# Patient Record
Sex: Male | Born: 1950
Health system: Southern US, Community
[De-identification: ages and names within clinical notes are randomized; demographics above are authoritative.]

## PROBLEM LIST (undated history)

## (undated) DIAGNOSIS — E119 Type 2 diabetes mellitus without complications: Secondary | ICD-10-CM

## (undated) DIAGNOSIS — C61 Malignant neoplasm of prostate: Secondary | ICD-10-CM

## (undated) HISTORY — PX: PROSTATECTOMY: SHX69

## (undated) HISTORY — DX: Type 2 diabetes mellitus without complications: E11.9

## (undated) HISTORY — DX: Malignant neoplasm of prostate: C61

## (undated) HISTORY — PX: CERVICAL FUSION: SHX112

## (undated) HISTORY — PX: APPENDECTOMY: SHX54

---

## 1998-12-09 ENCOUNTER — Ambulatory Visit (HOSPITAL_COMMUNITY): Admission: RE | Admit: 1998-12-09 | Discharge: 1998-12-09 | Payer: Self-pay | Admitting: Family Medicine

## 1998-12-09 ENCOUNTER — Encounter: Payer: Self-pay | Admitting: Family Medicine

## 2001-10-12 ENCOUNTER — Encounter: Payer: Self-pay | Admitting: Family Medicine

## 2001-10-12 ENCOUNTER — Inpatient Hospital Stay (HOSPITAL_COMMUNITY): Admission: RE | Admit: 2001-10-12 | Discharge: 2001-10-21 | Payer: Self-pay | Admitting: Family Medicine

## 2001-10-12 ENCOUNTER — Encounter (INDEPENDENT_AMBULATORY_CARE_PROVIDER_SITE_OTHER): Payer: Self-pay | Admitting: Specialist

## 2001-10-13 ENCOUNTER — Encounter: Payer: Self-pay | Admitting: General Surgery

## 2002-03-05 ENCOUNTER — Encounter: Payer: Self-pay | Admitting: Emergency Medicine

## 2002-03-05 ENCOUNTER — Inpatient Hospital Stay (HOSPITAL_COMMUNITY): Admission: EM | Admit: 2002-03-05 | Discharge: 2002-03-07 | Payer: Self-pay | Admitting: Emergency Medicine

## 2002-03-06 ENCOUNTER — Encounter: Payer: Self-pay | Admitting: General Surgery

## 2002-03-07 ENCOUNTER — Encounter: Payer: Self-pay | Admitting: General Surgery

## 2002-03-14 ENCOUNTER — Encounter: Admission: RE | Admit: 2002-03-14 | Discharge: 2002-03-14 | Payer: Self-pay | Admitting: General Surgery

## 2002-03-14 ENCOUNTER — Encounter: Payer: Self-pay | Admitting: General Surgery

## 2002-03-16 ENCOUNTER — Encounter: Payer: Self-pay | Admitting: General Surgery

## 2002-03-16 ENCOUNTER — Ambulatory Visit (HOSPITAL_COMMUNITY): Admission: RE | Admit: 2002-03-16 | Discharge: 2002-03-16 | Payer: Self-pay | Admitting: General Surgery

## 2003-08-24 ENCOUNTER — Encounter: Admission: RE | Admit: 2003-08-24 | Discharge: 2003-08-24 | Payer: Self-pay | Admitting: Family Medicine

## 2005-08-01 ENCOUNTER — Emergency Department (HOSPITAL_COMMUNITY): Admission: EM | Admit: 2005-08-01 | Discharge: 2005-08-01 | Payer: Self-pay | Admitting: Emergency Medicine

## 2005-08-01 IMAGING — CT CT CERVICAL SPINE W/O CM
1 series · 1 of 1 positions shown · IV contrast (agent unspecified)
Comparison: none

CLINICAL DATA: Status post fall.
 CERVICAL SPINE CT WITHOUT CONTRAST:
TECHNIQUE: Multidetector CT imaging of the cervical spine was performed.  Multiplanar CT  image reconstructions were also generated.

[Series 1: topogram ap 0.6 t80s · coronal · 0.6mm · 1.00mm/px · 1 of 1 slices shown]
[im 1/1]
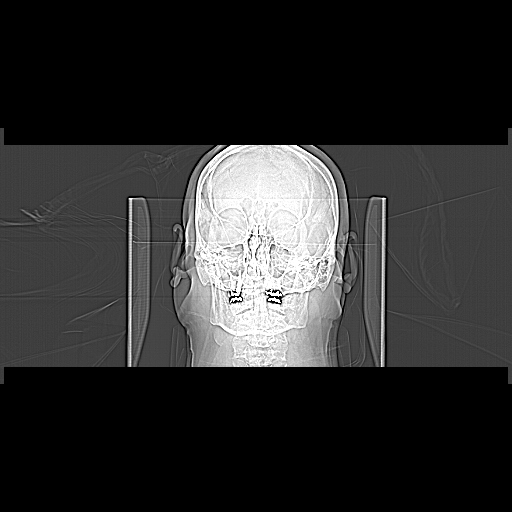

[1 of 1 positions shown; findings below may reference images not displayed]

FINDINGS: The alignment of the cervical spine is normal.  There has been surgical fusion of C5 through C7.  Anterior side plate and screw device is stable in position without evidence for hardware complications.
 Exuberant anterior osteophyte formation is identified at C2, C3 and C4 levels.
IMPRESSION: 1.  No acute findings.  Specifically, I see no acute fractures or dislocations.
 2.  Status post fusion of C5 through C7.  
 3.  Degenerative disk disease with exuberant anterior osteophyte formation extending from C2 through C4.

## 2008-06-05 IMAGING — CR DG CHEST 2V
2 series · 2 of 2 positions shown · non-contrast
Comparison: No priors

CLINICAL DATA: Preop for prostate cancer

CHEST - 2 VIEW

[w chest pa]
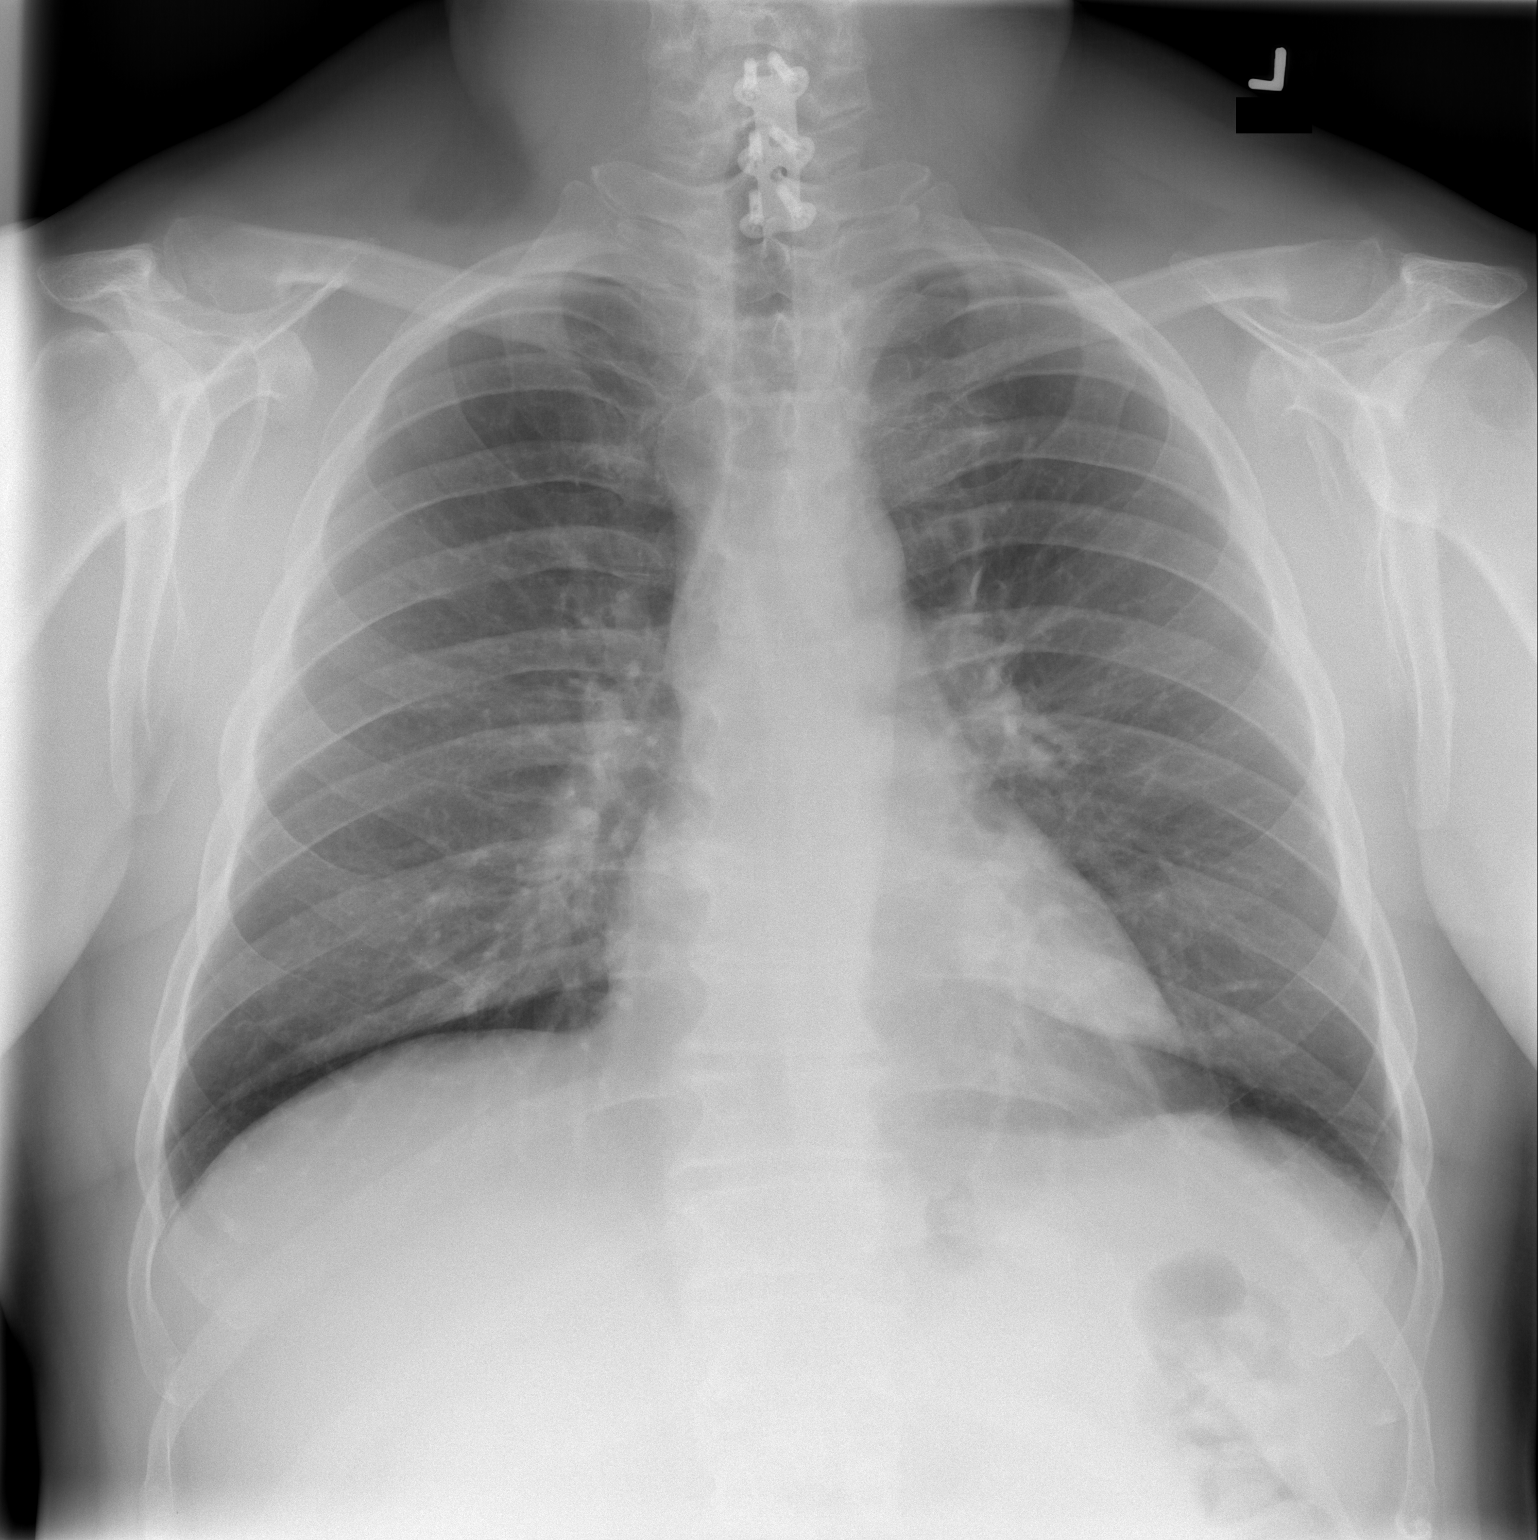

[w chest lat]
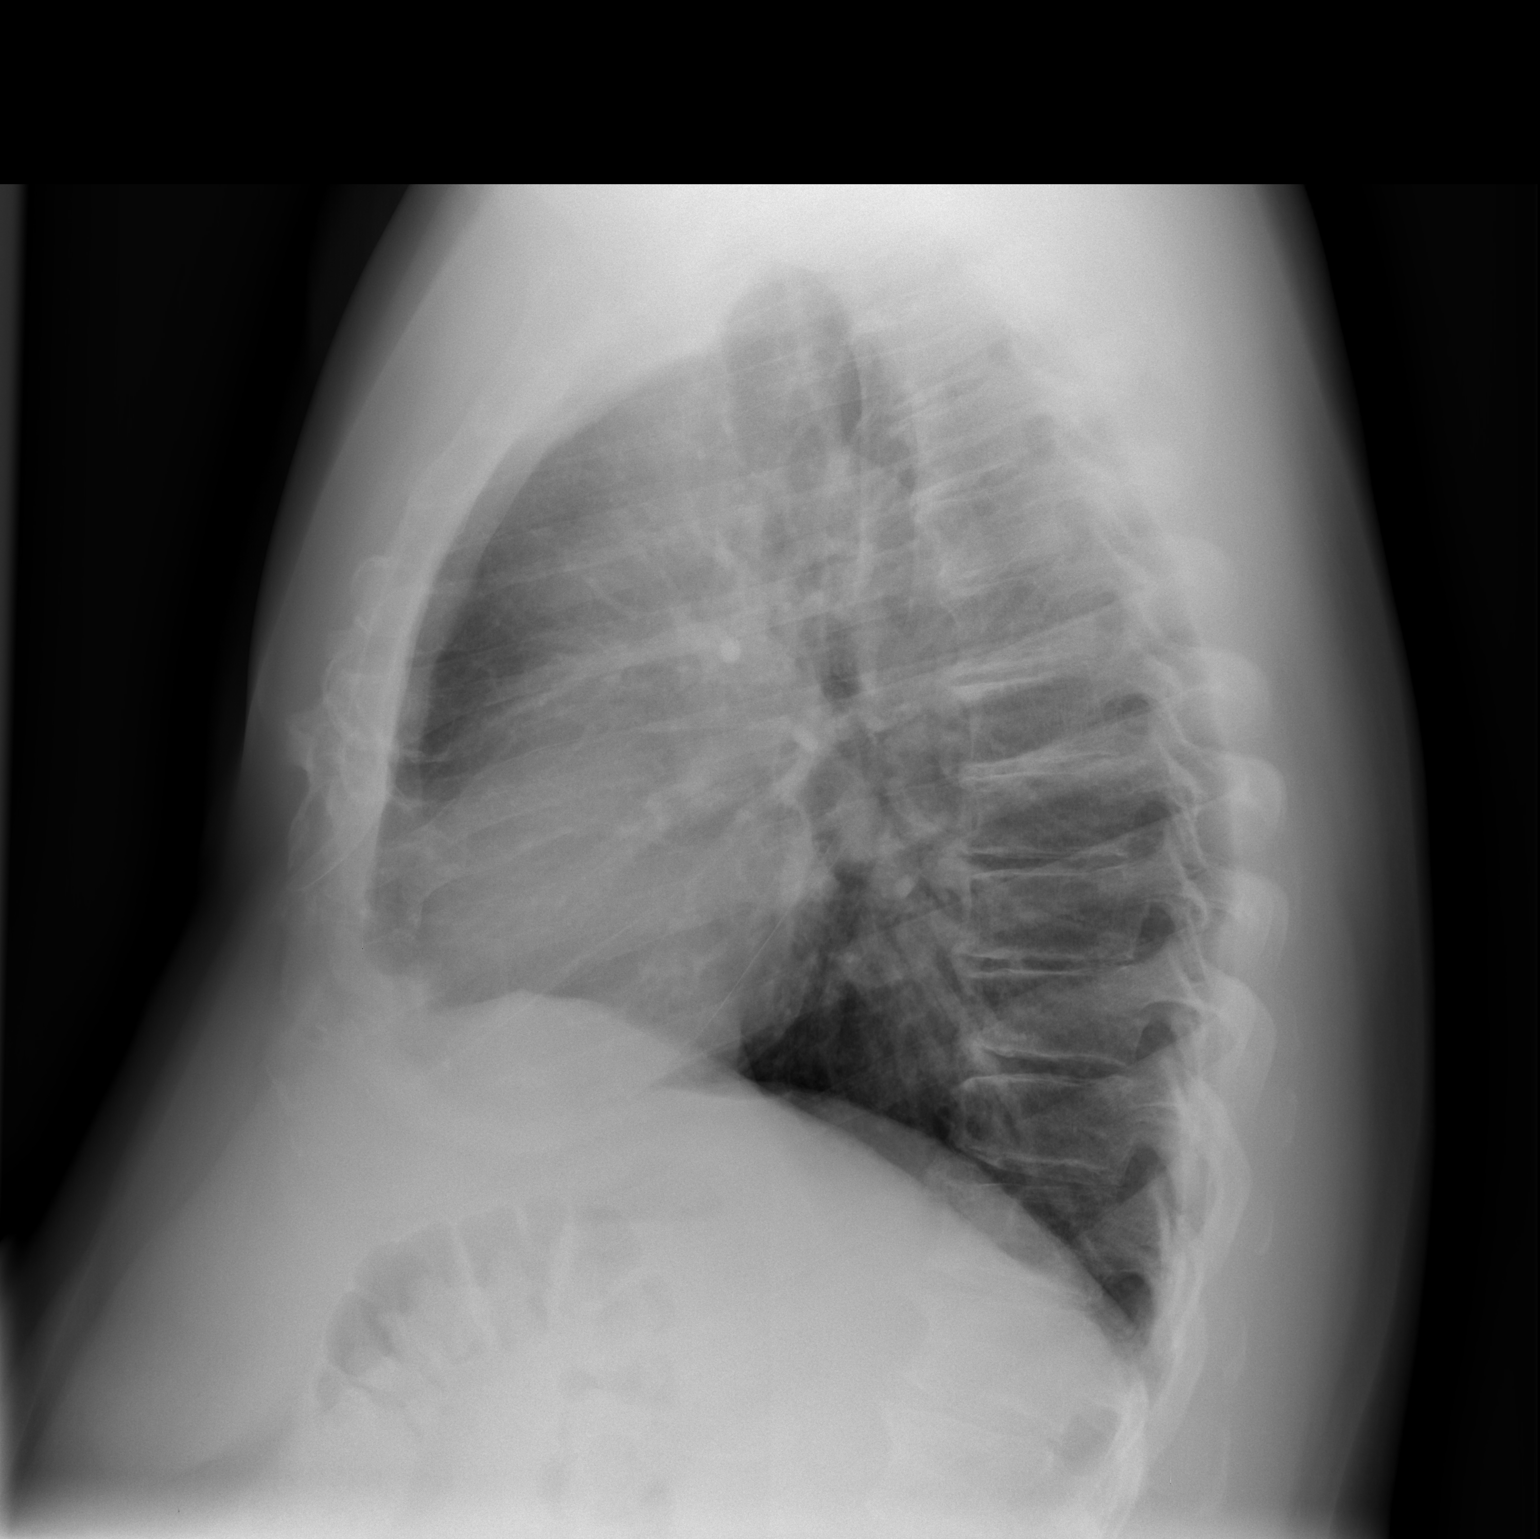

[2 of 2 positions shown; findings below may reference images not displayed]

FINDINGS: Heart and lungs normal.  No osseous lesions.  Prior
cervical fusion with plate.
IMPRESSION: No active disease.

## 2008-06-07 ENCOUNTER — Encounter (INDEPENDENT_AMBULATORY_CARE_PROVIDER_SITE_OTHER): Payer: Self-pay | Admitting: Urology

## 2008-06-07 ENCOUNTER — Inpatient Hospital Stay (HOSPITAL_COMMUNITY): Admission: RE | Admit: 2008-06-07 | Discharge: 2008-06-08 | Payer: Self-pay | Admitting: Urology

## 2009-01-06 ENCOUNTER — Encounter: Payer: Self-pay | Admitting: Emergency Medicine

## 2011-02-05 LAB — BASIC METABOLIC PANEL
BUN: 26 mg/dL — ABNORMAL HIGH (ref 6–23)
CO2: 20 mEq/L (ref 19–32)
Calcium: 8.6 mg/dL (ref 8.4–10.5)
Chloride: 100 mEq/L (ref 96–112)
Creatinine, Ser: 2.97 mg/dL — ABNORMAL HIGH (ref 0.4–1.5)
GFR calc Af Amer: 27 mL/min — ABNORMAL LOW (ref >60)
GFR calc non Af Amer: 22 mL/min — ABNORMAL LOW (ref >60)
Glucose, Bld: 492 mg/dL — ABNORMAL HIGH (ref 70–99)
Potassium: 5.9 mEq/L — ABNORMAL HIGH (ref 3.5–5.1)
Sodium: 135 mEq/L (ref 135–145)

## 2011-02-05 LAB — CBC
HCT: 34.9 % — ABNORMAL LOW (ref 39.0–52.0)
Hemoglobin: 11.5 g/dL — ABNORMAL LOW (ref 13.0–17.0)
MCHC: 32.8 g/dL (ref 30.0–36.0)
RBC: 3.78 MIL/uL — ABNORMAL LOW (ref 4.22–5.81)

## 2011-02-05 LAB — POCT I-STAT 3, ART BLOOD GAS (G3+)
Acid-base deficit: 11 mmol/L — ABNORMAL HIGH (ref 0.0–2.0)
Bicarbonate: 17.4 mEq/L — ABNORMAL LOW (ref 20.0–24.0)
O2 Saturation: 96 %
Patient temperature: 96.5
pO2, Arterial: 94 mmHg (ref 80.0–100.0)

## 2011-02-05 LAB — DIFFERENTIAL
Basophils Relative: 0 % (ref 0–1)
Eosinophils Relative: 0 % (ref 0–5)
Monocytes Absolute: 0.7 10*3/uL (ref 0.1–1.0)
Monocytes Relative: 5 % (ref 3–12)
Neutro Abs: 11.7 10*3/uL — ABNORMAL HIGH (ref 1.7–7.7)

## 2011-02-05 LAB — POCT CARDIAC MARKERS
CKMB, poc: 3.1 ng/mL (ref 1.0–8.0)
Myoglobin, poc: 268 ng/mL (ref 12–200)

## 2011-03-10 NOTE — Op Note (Signed)
Luke Nelson, Luke Nelson            ACCOUNT NO.:  [id number]   MEDICAL RECORD NO.:  [id number]          PATIENT TYPE:  INP   LOCATION:  1444                         FACILITY:  [facility]   PHYSICIAN:  Heloise Purpura, MD      DATE OF BIRTH:  02/25/1951   DATE OF PROCEDURE:  06/07/2008  DATE OF DISCHARGE:                               OPERATIVE REPORT   PREOPERATIVE DIAGNOSIS:  Clinically localized adenocarcinoma of the  prostate.   POSTOPERATIVE DIAGNOSIS:  Clinically localized adenocarcinoma of the  prostate.   PROCEDURES:  1. Laparoscopic intra-abdominal adhesiolysis (This added a significant      component of time to the procedure today, and was related to the      patient's prior exploratory laparotomy.  Total time taking down      adhesions was approximately 1 hour.)  2. Robotic-assisted laparoscopic radical prostatectomy (partial right      nerve sparing).  3. Bilateral pelvic laparoscopic lymphadenectomy.   SURGEON:  Heloise Purpura, MD   FIRST ASSISTANT:  Delman Kitten, MD.   SECOND ASSISTANT:  Delia Chimes, NP.   ANESTHESIA:  General.   COMPLICATIONS:  Cystotomy -- repaired primarily.   ESTIMATED BLOOD LOSS:  1500 mL.   SPECIMENS:  1. Prostate and seminal vesicles.  2. Right pelvic lymph nodes.  3. Left pelvic lymph nodes.   DISPOSITION:  Specimens to pathology.   DRAINS:  1. 20-French Coude catheter.  2. A #19 Blake pelvic drain.   INDICATION:  Mr. Somerville is a 60 year old gentleman who was recently  diagnosed with clinically localized adenocarcinoma of prostate (clinical  stage T2b N0 MX).  After a discussion regarding management options for  treatment, he elected to proceed with surgical therapy and the above  procedure.  The essential risks, complications, and alternative  treatment options associated with the above procedures were discussed  with the patient in detail, and informed consent was obtained.  Specifically, due to the patient's prior history of  an exploratory  laparotomy and intra-abdominal infection, it was discussed with him that  he did have a significant risk for open surgical conversion.   DESCRIPTION OF PROCEDURE:  The patient was taken to the operating room  and a general anesthetic was administered.  He was given preoperative  antibiotics, including Ancef and Flagyl; and he was placed in the dorsal  lithotomy position.  He was then prepped and draped in the usual sterile  fashion.  A preoperative time-out was performed.  A Foley catheter was  inserted into the bladder.  The patient's abdomen was inspected and he  did have a midline abdominal incision from his prior exploratory  laparotomy.  An incision was initially made lateral to the midline,  approximately 18 cm from the pubic symphysis and carried down through  the subcutaneous tissue.  The anterior rectus fascia was divided and the  underlying rectus muscle fibers were swept laterally to either side,  thereby exposing the posterior rectus sheath which was sharply incised.  The peritoneum was identified and was sharply entered.  Digital  inspection of the abdomen did reveal intra-abdominal adhesions, and it  was  not felt safe to place a port here.  It was therefore decided to try  the right lateral abdominal wall.  The patient was rotated to the left  and in the right lateral abdominal wall, a horizontal incision was made  in the skin and again carried down through the layers of the abdominal  wall.  The peritoneum was identified and was sharply incised, allowing  entry into the peritoneal cavity using Hasson technique.  The area was  noted to be free of adhesions upon digital palpation.  A 12 mm port was  therefore inserted and a pneumoperitoneum established.  The abdomen was  then inspected with the 0 degree lens.  This revealed extensive  adhesions along the midline of the abdomen from approximately just below  the umbilicus cephalad.  There were minimal adhesions  in the pelvis and  lower abdomen.  It was felt that with an adhesiolysis, the patient could  proceed with a laparoscopic procedure.  Therefore an 8-mm robotic port  was placed in the right lower quadrant in its normal expected position  for the procedure.  The laparoscopic scissors were then used to take  down the adhesions sharply in the abdominal wall under direct vision.  Once enough adhesions had been taken down, a 5-mm port was placed just  to the right of the umbilicus, again for laparoscopic assistance.  This  was also placed in the normal expected position for this procedure.  This allowed more mobility, and further adhesiolysis of the midline  adhesions was performed, until the camera port site was free and a 12-mm  port was safely placed under direct vision.  Two left lower quadrant 8-  mm robotic ports were also placed.  Total time of the adhesiolysis was  approximately 1 hour.  The surgical cart was then docked.  The patient  was noted to have a large amount of intra-abdominal and pelvic fat, as  well as perivesical fat.  Using the cautery scissors, the bladder was  reflected posteriorly allowing entry into the space of Retzius and  identification of the endopelvic fascia.  The endopelvic fascia was then  incised from the apex back to the base of the prostate bilaterally, and  the underlying levator muscle fibers were swept laterally off the  prostate; thereby isolating the dorsal venous complex.  The dorsal  venous complex was then stapled and divided with a 45-mm flex ET  stapler.   The area was then examined.  The bladder neck was poorly visualized due  to the amount of perivesical fat, despite attempts at manipulation of  the Foley catheter.  The bladder neck was divided anteriorly and the  Foley catheter was identified and was able to be exposed.  The patient  was noted to have an extremely large prostate with large lateral lobes  of the prostate.  The bladder neck was  carefully freed from the prostate  and dissection proceeded posteriorly.  Due to the large prostate, this  dissection was extremely deep.  During this dissection, there was noted  to be an inadvertent cystotomy made.  Indigo carmine had been  administered, and the ureteral orifices were identified and noted to be  away from the cystotomy.  This was immediately repaired with a figure-of-  eight 3-0 Vicryl suture.  Dissection was then redirected more caudally.  The seminal vesicles were identified.  The vasa deferentia were  isolated, divided and lifted anteriorly.  The seminal vesicles were then  dissected free down to  their tips, with care to control the seminal  vesicle arterial blood supply.  The seminal vesicles were then lifted  anteriorly and the space between Denonvilliers' fascia and the anterior  rectum was bluntly developed, thereby isolating the vascular pedicles of  the prostate.  Due to the large size of the prostate, as well as the  anatomy of the patient's pelvis, it was extremely difficult to provide  appropriate counter traction of the prostate during ligation of the  prostatic pedicles.  On the right side, the lateral prostatic fascia was  incised, allowing the neurovascular bundles to be released.  On the left  side a planned wide, non-nerve sparing procedure was performed -- due to  the patient's extensive cancer and abnormal digital rectal exam on this  side.  The vascular pedicles of the prostate were then ligated with Hem-  o-Lok clips and divided with sharp dissection.  During this dissection,  multiple large vessels were encountered and despite attempts to  prospectively control these areas, there was noted be excessive bleeding  despite the pneumoperitoneum.  These areas resulted in a moderate amount  of blood loss and did require suture ligation for control.  The urethra  was then identified and was divided, allowing the prostate to eventually  be disarticulated  -- although with a quite difficult dissection.  The  pelvis was then copiously irrigated.  There was noted to be an  additional arterial bleeding site toward the apex of the prostatic  fossa.  This was ligated with a figure-of-eight 3-0 Vicryl suture.  The  pelvis was then again irrigated with irrigation in the pelvis, air was  injected into the rectal catheter.  There was no evidence of a rectal  injury.  Surgicel was then placed into the pelvis to aid with hemostatic  control, and attention turned to the right pelvic sidewall.  The  fibrofatty tissue between the external iliac vein, confluence of the vas  vessels, the gastric artery and Cooper's ligament was dissected free  from the pelvic sidewall -- with care to preserve the obturator nerve.  Hem-o-lok clips were used for lymphostasis and hemostasis.  An identical  procedure was then performed on the contralateral side and both  lymphatic packets were removed for permanent pathologic analysis.   Attention then returned to the pelvis.  The Surgicel was removed and  there did appear to be adequate hemostasis, except for one area toward  the right distal neurovascular bundle.  This was ligated with a figure-  of-eight 3-0 Vicryl.  A 2-0 Vicryl slip-knot was then placed between  Denonvillier's fascia, the posterior bladder neck and the posterior  urethra to reapproximate these structures.  A double-locked 3-0 Monocryl  suture was used to perform a 360 degree running tension-free anastomosis  between the bladder neck and urethra.  A new 20-French Coude catheter  was then inserted into the bladder and irrigated.  There were no blood  clots within the bladder, and the anastomosis appeared to be watertight.  A #19 Blake drain was then brought through the left robotic port and  appropriately positioned in the pelvis.  It was secured to the skin with  a nylon suture.  The surgical cart was then undocked.  The prostate  specimen was removed  intact within the Endopouch retrieval bag via the  periumbilical port site.  All remaining ports had been removed under  direct vision, after the right lateral port site (which was the initial  entry site into the abdomen)was  closed with a figure-of-eight 0 Vicryl  suture -- placed with the aid of the Carter-Thomason device.  The  periumbilical port site was closed with a running 0 Vicryl suture, and  all sites were instilled with 0.25% Marcaine, reapproximated at the skin  level with staples, and covered with sterile dressings.   The patient appeared to tolerate the procedure well.  He was able to be  extubated and transferred to the recovery unit in satisfactory  condition.      Heloise Purpura, MD  Electronically Signed     LB/MEDQ  D:  06/07/2008  T:  06/07/2008  Job:  [id number]

## 2011-03-13 NOTE — Op Note (Signed)
[facility]  Patient:    DURREL, MCNEE Visit Number: [id number] MRN: [id number]          Service Type: SUR Location: 4W [id number] [id number] Attending Physician:  Henrene Dodge Dictated by:   Anselm Pancoast. Zachery Dakins, M.D. Proc. Date: 10/13/01 Admit Date:  10/12/2001   CC:         Anselm Pancoast. Zachery Dakins, M.D.  Arvella Merles, M.D.   Operative Report  PREOPERATIVE DIAGNOSIS:  Probable small bowel obstruction, fatty liver.  POSTOPERATIVE DIAGNOSIS:  Adhesions, distal small bowel from previous appendectomy, probable fatty liver.  OPERATIONS: 1. Exploratory laparotomy. 2. Lysis of adhesions. 3. Tru-Cut liver biopsy.  ANESTHESIA:  General.  SURGEON:  Anselm Pancoast. Zachery Dakins, M.D.  ASSISTANT:  Currie Paris, M.D.  HISTORY:  Blayze Haen is a 60 year old moderately overweight Caucasian male referred by Dr. Tiburcio Pea yesterday, December 18, for ultrasound and then a CT. He has had severe abdominal pain that started approximately three weeks ago with some blood in his urine and he was treated with Cipro and he had a mildly elevated PSA. He started having abdominal complaints after approximately a week on the Cipro and this progressed and then last Friday, December 13, he started having more severe pain, abdominal distention, and bloating. He has not been on extensive pain medication that I am aware of but had basically very marked pain 48 hours ago, and for this reason they referred him for an ultrasound and then with a questionable _______ proceeded on with CT. I saw the patient and his small bowel was very dilated. I recommended we give him rectal contrast instead of actually oral contrast and this was done. The contrast went throughout the colon and on review of the CT, it appears that the transition is kind of in the distal ileal area. His only previous surgery has been an appendectomy years ago.  DESCRIPTION OF PROCEDURE:  The patient  was taken to the operative suite, induction of general anesthesia, and Foley catheter was inserted. We noted that there is a red splotchy area in the lower abdomen and the patients wife said that he used a heating pad on his abdomen two nights ago when he was having such severe pain that most likely this is the cause of the erythema localized. The abdomen was prepped with Betadine surgical solution and draped in a sterile manner. A small midline incision was made. Sharp dissection was made through the skin, subcutaneous, and fascia and upon opening into the peritoneal cavity, there was a little bit of fluid but it certainly is not as marked as we had seen on the CT December 18. The small bowel area that we could see looked okay and it was necessary to extend the incision some because of just his size. With the extended incision a little bit inferior and above the umbilicus, we now could kind of manipulate the small bowel and there were a few adhesions in the distal terminal bowel where the small bowel possibly is sort of twisted but not really a definite point of obstruction that I can visualize. The colon was normal by palpation, both in the left and right. He does have some adhesions, like possibly kind of early diverticulitis but certainly nothing like acute diverticulitis. We reinspected the small bowel, felt the mesentery, could not feel any definite enlarged lymph nodes or other things. The gallbladder was distended but there was no stones and the little areas of the liver that had been seen on  the ultrasound are little hemangiomas, more on the left lobe than right. You could see them blue and we did not biopsy these. I did two passes of the Tru-Cut needle in the right lobe to basically review his liver since it was interpreted as a real fatty liver on CT. The patient is not a heavy alcohol user and has had no previous history of hepatitis. With this and the hemostasis controlled with  a little cautery to the biopsy area, we then repositioned the small bowel in normal anatomical position. The omentum was brought over it and then closed the incision with interrupted sutures of #1 Novafil. The patient tolerated the procedure nicely and the NG tube is in good position. We will leave the Foley, he had amount of urine in his bladder, and will reassess his small bowel progression with NG tube _______. We plan to use PCA morphine postoperatively for pain control but I am not planning to keep him on IV antibiotics. He was given one dose of Unasyn when admitted preoperatively but I am not planning to continue postoperatively. Dictated by:   Anselm Pancoast. Zachery Dakins, M.D. Attending Physician:  Henrene Dodge DD:  10/13/01 TD:  10/15/01 Job: [id number]

## 2011-03-13 NOTE — Discharge Summary (Signed)
[facility]  Patient:    Luke Nelson, Luke Nelson Visit Number: [id number] MRN: [id number]          Service Type: SUR Location: 4W [id number] [id number] Attending Physician:  Henrene Dodge Dictated by:   Anselm Pancoast. Zachery Dakins, M.D. Admit Date:  03/05/2002 Discharge Date: 03/07/2002                             Discharge Summary  DISCHARGE DIAGNOSIS:  Severe constipation with severe cramping and abdominal pain with partial obstruction.  PROCEDURES:  None.  HISTORY OF PRESENT ILLNESS:  The patient is a 60 year old male who I first met approximately six months ago when he had had kind of cramping bloating and abdominal pain, diarrhea and was thought to have a bowel obstruction.  He underwent an exploratory laparotomy and found to have a few adhesions down in the terminal ileum.  Postoperatively, a C. difficile was found to be positive and it appeared that his abdominal complaints and symptoms were more related to the C. difficile colitis instead of an actual mechanical small bowel obstruction.  During that evaluation, he had been evaluated with a mint Gastrografin barium enema with reflux to terminal ileum.  He had had a fatty liver diagnosed on a CT and at the time of surgery, we did do a liver biopsy that just showed kind of a fatty liver.  He had had an appendectomy.  He gives a history of having previous kidney stones, but during that evaluation, he had been treated with antibiotics for about two to three weeks prior to that presentation for blood in the urine and had never had a definite urinary tract infection or kidney stone documented.  After he recovered with p.o. Flagyl, he had decided to be on a weight reduction diet to correct the fatty liver type problems and has lost approximately 30 pounds and appears to have done nicely and has been seen in the office several times.  I have rechecked a stool that was negative for C. difficile and had released the  patient.  During this presentation, he had been moving his daughter from a dormitory to home for the Summer with a lot of physical activity and started having cramping abdominal pain.  He called the physician on call who advised him to take laxatives.  He did this with no improvement.  On the second call, he was advised to take Fleet Phospho-Soda and then started having severe cramping abdominal pain. He was advised to come to the emergency room.  Dr. Earlene Plater was on call and he admitted him on May 11.  His abdominal x-rays at that time in the emergency room.  She showed a large amount of stool in the right colon with only a small amount of small bowel gas and it appeared that this was a more cramping constipation than a true bowel obstruction.  Dr. Earlene Plater put him in the hospital with IV fluid bolus and gave him Dilaudid for pain and Zosyn for nausea.  He repeated blood work the following morning and asked me to pick the patient up the following morning which was a Monday.  LABORATORY DATA AND X-RAY FINDINGS:  White count 11,700, hematocrit 45 with left shift.  Electrolytes normal with a glucose of 119, BUN 15.  We ordered a stool which was negative.  Specific gravity of his urine was 1.022, with a concentrated urine.  Acute abdomen x-ray was read as no evidence  of obstruction or free air with a large amount of stool in the right colon.  HOSPITAL COURSE:  The following day, he felt better, but of course, he was receiving the Dilaudid.  On my examination, I thought that we really needed to stop the Dilaudid and put him on a clear liquid diet and work with getting the stool out of his colon and this was started.  The following day, the plain abdominal flat and upright showed focal type ileus of small bowel in left upper quadrant thought to be very slight.  The Dilaudid was stopped and he had some cramping pain.  That evening, he had a large explosive bowel movement with a large amount of  stool and then basically his pain resolved.  The following morning on examination, he was definitely better.  The KUB showed no dilated loops and I had placed him on a full liquid diet and thought that if this was tolerated he could be discharged.  He did tolerate the full liquid diet.  On the afternoon of May 13, he was released.  He will see Dr. Zachery Dakins in the office in several days after continuing with the full liquids and then gradually advance up to a regular diet.  I do not think he truly has a mechanical small bowel obstruction at this time, but he will be followed in the office.  The patient, when he was in the hospital in December, was given contrast per rectum for a CT and if he has truly had a BE, I am not positive.  If he would continue to have problems will constipation, we will recommend a colonoscopy. His stools are Hemoccult negative. Dictated by:   Anselm Pancoast. Zachery Dakins, M.D. Attending Physician:  Henrene Dodge DD:  04/03/02 TD:  04/03/02 Job: [id number]

## 2011-03-13 NOTE — H&P (Signed)
[facility]  Patient:    GURKARAN, RAHM Visit Number: [id number] MRN: [id number]          Service Type: SUR Location: 4W [id number] 01 Attending Physician:  Henrene Dodge Dictated by:   Anselm Pancoast. Zachery Dakins, M.D. Admit Date:  10/12/2001                           History and Physical  CHIEF COMPLAINT:  Abdominal pain and bloating.  HISTORY OF PRESENT ILLNESS:  Deontrey Massi is a 60 year old, moderately overweight, Caucasian male who I was asked to see while he was undergoing x-ray evaluations for probably a bowel obstruction.  The patient states that approximately three weeks earlier, he noted some blood in his urine and was being treated by Dr. Tiburcio Pea of [facility], first with Cipro, and then this was increased and then he started having symptoms of abdominal pain. The pain was thought possibly related to the antibiotics and he decreased the antibiotics frequency, and then on Friday, four days earlier, he started having more severe pain, bloated, was not actually vomiting frequently, but obviously was becoming distended.  He returned and was seen by Dr. Tiburcio Pea, who did lab studies and then referred him over for an ultrasound of the upper abdomen, thinking that this was possibly a gallbladder problem.  On the ultrasound, they could not see any gallstones, but he had a very distended small intestine, thought possibly to be an obstruction and they recommended that CT be performed and called me to see him for probably a bowel obstruction.  PAST HISTORY: 1. He has had an appendectomy years ago as a younger teenager and he has had    no other abdominal surgery. 2. He has had problems with spinal effusions, but that is the only    abnormality.  ALLERGIES:  None known.  He said PREDNISONE when he was treated for some type of allergic reaction caused something.  CHRONIC MEDICATIONS:  He is really not on any.  PREVIOUS  SURGERIES: 1. He had a spinal fusion, cervical. 2. He has had an appendectomy years ago.  REVIEW OF SYSTEMS:  EENT:  Denies acute symptoms.  CHEST:  No cough or pulmonary type symptoms.  CARDIAC:  No history of high blood pressure or cardiac arrhythmias or chest pain.  ABDOMEN:  See history of present illness. GENITOURINARY:  He has been evaluated by Dr. Aldean Ast previously for prostate infection, on that I am not sure.  The patient has a family history of cancer of the prostate, and I think he has had a mildly elevated PSA recently and they were wondering if this was related to infections, since he had blood in his urine.  MUSCULOSKELETAL:  Previous neck problem.  He sits a lot and he is a little overweight.  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  His wife is with the patient.  PHYSICAL EXAMINATION:  GENERAL:  The patient looks acutely ill, very bloated.  VITAL SIGNS:  In the emergency room blood pressure is 120/74, pulse 69, respirations 18, he is afebrile at 97.4.  HEENT:  He does not appear to be dehydrated, even though he says he has been able to really eat well over the last 3-4 days.  NECK:  No cervical or supraclavicular nodes.  LUNGS:  Increase in AP diameter.  When he takes deep breaths, he  complains of abdominal pain.  CARDIAC:  Normal sinus rhythm, no murmur, gallop, or rub.  ABDOMEN:  He had kind of a quiet abdomen, very bloated, non-umbilical inguinal hernia secondary to very small transverse Rockey-Davis type of appendectomy incision.  RECTAL:  Prostate exam:  He does not really appear to have an enlarged definite prostate back until a little bit of soft stool in the rectum.  MUSCULOSKELETAL:  I did not do an extensive musculoskeletal evaluation.  He has had a history of previous spinal cervical fusion.  LABORATORY:  Abdominal x-rays:  On the ultrasound I questioned some  spots on his liver.  There were no gallstones noted.  CT was then performed and the liver areas are probable capillary hemangiomas _________ there was nothing consistent with metastatic disease on CT.  He does have a fatty liver.  His small bowel is very dilated.  There is a small amount of gas within the colon, but there is not an obvious massive colon dilatation.  The transition zone on the CT appears to be kind of in the distal small bowel kind of down with the previous appendectomy type incision.  Liver function studies are normal.  The patient denies any excessive use of alcohol or previous exposure to hepatitis.  ADMISSION IMPRESSION:  Abdominal pain, small bowel obstruction, more possibly ileus, rule out a C. difficile colitis, even though his white count is not elevated and he is not having diarrhea.  PLAN:  The patient will be admitted, intravenous fluids, NG tube placed, and further evaluation.  We will get a hepatitis chronic panel. Dictated by:   Anselm Pancoast. Zachery Dakins, M.D. Attending Physician:  Henrene Dodge DD:  10/14/01 TD:  10/14/01 Job: [id number]

## 2011-03-13 NOTE — Discharge Summary (Signed)
[facility]  Patient:    Luke, Nelson Visit Number: [id number] MRN: [id number]          Service Type: SUR Location: 4W 0449 01 Attending Physician:  Henrene Dodge Dictated by:   Anselm Pancoast. Zachery Dakins, M.D. Admit Date:  10/12/2001 Discharge Date: 10/21/2001   CC:         Rozanna Boer., M.D.  Arvella Merles, M.D.   Discharge Summary  DISCHARGE DIAGNOSES: 1. Clostridium difficile colitis. 2. Ileus versus mechanical small bowel obstruction. 3. Fatty liver.  OPERATION:  Exploratory laparotomy, lysis of adhesions, terminal ileum and core biopsy liver.  HISTORY OF PRESENT ILLNESS:  Luke Nelson is a 60 year old Caucasian male who approximately 3 weeks ago after symptoms of frequency of voiding the evening was noted to have a small amount of blood in his urine and with slightly elevated PSA.  He has a family history of cancer of the prostate, and was placed on Cipro by Dr. Holley Bouche.  The patient after 4 or 5 days on the Cipro started having sort of vague abdominal symptoms, and then after about 2 weeks on the Cipro started having more intense pain, he started decreasing his doses of Cipro on his own accord.  He saw Dr. Tiburcio Pea in followup, and was complaining of significant abdominal pain, and they repeated laboratory studies, and this was done on 10/11/01, with his liver tests being normal except for an SGPT of 93, upper limits of normal, and an amylase was 29, lipase 11.5.  White count was 9700 with 72% neutrophils.  Hematocrit was 45.  The following day he was scheduled for an ultrasound of his gallbladder. He was describing the pain more in the upper abdomen.  He complained of nausea and some more bloating.  One day he had several loose bowel movements, and then the following day on the ultrasound it showed no stones within his gallbladder, but his small bowel was significantly dilated, and they  suggested obtaining a CT.  Dr. Tiburcio Pea called for a surgical consultation at that time, and I saw the patient in x-ray.  He was more or less arriving with pains, very bloated, he is a large individual anyway, and in discussion with the radiologist, it certainly appeared that he had an intestinal obstruction.  He had been throwing up, and we elected to give him rectal contrast to the small bowel because of the possibility of him possibly needing emergency surgery. He had the rectal contrast, and this with the CT and the intravenous contrast, we noted that he has a significant fatty liver, there is some question of defects of the liver on the ultrasound, and these were noted to be venous ______, and the small bowel was markedly dilated on down to the terminal ileum where there was definitely a change in the caliber of the bowel.  Years ago the patient had an appendectomy as a child, and we placed a nasogastric tube, started him on intravenous fluids, and discussed about that he may or may need a laparotomy.  The following day, he had repeat x-rays.  He had a large amount of NG drainage, and basically no bowel movements.  He had one bowel movement right after the CT, but he was getting rid of the rectal contrast he had been given.  We had ordered a stool C. diff since he had been ill for approximately 3 weeks, but he was in the emergency room and basically a stool was not obtained.  The following day on the x-rays, the contrast that had been given into the colon was still present.  The small bowel was not as maximally dilated, but it was thought that he had decompressed only from what had sucked out through the nasogastric tube, as he had no passage of the contrast that had been given rectally, and I interpreted this as he probably did have a mechanical small bowel obstruction.  His white count here, the initial white count was 10,000, and the following day it was actually 8300. The patient was  concerned of what was going on, he stated he was feeling no better, not interested in waiting to see if there was any further improvement with conservative management, and desired to proceed on with a laparotomy.  He was taken to surgery on October 13, 2001, Dr. Jamey Ripa assisted.  Even though he had a very dilated small bowel, even in the terminal ileum where he had a few adhesions, it was really not a complete transition like you would see if there was actually an obstruction secondary to adhesions.  His liver did look fatty, and a core biopsy x 2 was obtained.  He had some little ______  cysts, these were deemed noted on the ultrasound.  We continued with the nasogastric tube.  I had placed a Foley catheter in at the time.  I gave him one dose of Unasyn preoperatively, but not continue it postoperatively since there was no active infection noted, and no intestinal surgery except for lysis of adhesions.  Over the next about 3 days, he was sore, his pain was controlled with PCA morphine, and he was taking a dose in relationship to his body weight, and had a moderate amount of NG drainage.  The bowels started having a little passage of flatus and gas, and about the third postoperative day it was elected to take the NG tube out the following morning, and he kept in good p.o., and then continued to have gas, and with noted improvement started on a liquid diet.  Just as soon as he actually was able to basically collect a stool sample, it was sent for the C. diff that had been ordered originally, and it did return positive for C. diff colitis.  At that point, we did start him on p.o. Flagyl, and his abdominal function has continued to improve.  His incision is healing nicely.  In retrospect, this is probably an atypical presentation of C. diff colitis as the cause of the abdominal distention and what was interpreted as an obstruction.  He had a few loose bowel movements, but he did not have an  elevated white count, did not have a fever, and of  course the abdominal pain would have been consistent with C. diff colitis. His Foley catheter had been removed on about the second postoperative day, and I did ask Dr. Aldean Ast to stop by and say hi since he will need to be followed for his PSA at a later time, and Dr. Aldean Ast has recommended that he be seen in her office in approximately I think 3 weeks.  He is ready for discharge now.  Denies no need for pain medication.  He is being sent home on Flagyl 250 mg q.i.d. x 1 week, and will follow up in our office in approximately 10 days.  He can return to work next week if he feels like it.  DIET:  He is going to be on full liquids, advancing onto a regular diet  at this time, and he can advance his diet as tolerated.  His incisions are healed satisfactorily.  The staples were removed.  The wound Steri-Strips.  His pathology report of the fatty liver shows marked steatosis, and this is thought to be related basically his exogenous obesity and not that of a definite cirrhosis or other problems since there was no fibrosis identified, and no increase iron.  CONDITION ON DISCHARGE:  The patient is discharged in improved condition, and has copies of his pathology reports and labs.  FOLLOWUP: 1. He will see me in the office in approximately 7 to 10 days. 2. Follow up with Dr. Aldean Ast in approximately 3 weeks.  I did not repeat a PSA.  He is voiding without problems at this time. Dictated by:   Anselm Pancoast. Zachery Dakins, M.D. Attending Physician:  Henrene Dodge DD:  10/21/01 TD:  10/22/01 Job: [id number]

## 2011-07-24 LAB — CBC
Hemoglobin: 14.9
Platelets: 216
RBC: 4.91
RDW: 13.9
WBC: 7.2

## 2011-07-24 LAB — DIFFERENTIAL
Basophils Absolute: 0.1
Eosinophils Relative: 0
Lymphocytes Relative: 10 — ABNORMAL LOW
Neutro Abs: 11.1 — ABNORMAL HIGH
Neutrophils Relative %: 82 — ABNORMAL HIGH

## 2011-07-24 LAB — BASIC METABOLIC PANEL
BUN: 10
Calcium: 7.7 — ABNORMAL LOW
Calcium: 9
GFR calc Af Amer: >60
GFR calc non Af Amer: >60
GFR calc non Af Amer: >60
Glucose, Bld: 148 — ABNORMAL HIGH
Potassium: 4.6
Sodium: 142

## 2011-07-24 LAB — HEMOGLOBIN AND HEMATOCRIT, BLOOD
HCT: 34.5 — ABNORMAL LOW
HCT: 42.1
Hemoglobin: 11.6 — ABNORMAL LOW
Hemoglobin: 14

## 2011-07-24 LAB — TYPE AND SCREEN
ABO/RH(D): O POS
Antibody Screen: NEGATIVE

## 2011-07-24 LAB — ABO/RH: ABO/RH(D): O POS

## 2015-10-10 ENCOUNTER — Ambulatory Visit: Payer: BLUE CROSS/BLUE SHIELD | Admitting: Neurology

## 2015-10-23 ENCOUNTER — Encounter: Payer: Self-pay | Admitting: Neurology

## 2015-10-23 ENCOUNTER — Ambulatory Visit (INDEPENDENT_AMBULATORY_CARE_PROVIDER_SITE_OTHER): Payer: BLUE CROSS/BLUE SHIELD | Admitting: Neurology

## 2015-10-23 VITALS — BP 138/74 | HR 71 | Ht 71.0 in | Wt 272.0 lb

## 2015-10-23 DIAGNOSIS — F0781 Postconcussional syndrome: Secondary | ICD-10-CM | POA: Diagnosis not present

## 2015-10-23 NOTE — Patient Instructions (Signed)
1. Schedule MRI brain without contrast 2. Physical and cognitive rest are important after a concussion 3. Continue to monitor mood 4. Follow-up in 3 months, call for any changes
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
## 2015-11-11 ENCOUNTER — Telehealth: Payer: Self-pay | Admitting: Neurology

## 2015-11-11 NOTE — Telephone Encounter (Signed)
Patient returned my call. Notified him of results. 

## 2015-11-11 NOTE — Telephone Encounter (Signed)
Have you had a chance to look at his scan?

## 2015-11-11 NOTE — Telephone Encounter (Signed)
Pt wants to see if the mri result are back please call [phone]
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
## 2015-11-11 NOTE — Telephone Encounter (Signed)
Lmovm to rtn my call. 

## 2016-01-28 ENCOUNTER — Ambulatory Visit (INDEPENDENT_AMBULATORY_CARE_PROVIDER_SITE_OTHER): Payer: BLUE CROSS/BLUE SHIELD | Admitting: Neurology

## 2016-01-28 ENCOUNTER — Encounter: Payer: Self-pay | Admitting: Neurology

## 2016-01-28 VITALS — BP 126/74 | HR 67 | Ht 71.0 in | Wt 271.0 lb

## 2016-01-28 DIAGNOSIS — F0781 Postconcussional syndrome: Secondary | ICD-10-CM

## 2016-01-28 DIAGNOSIS — F411 Generalized anxiety disorder: Secondary | ICD-10-CM | POA: Diagnosis not present

## 2016-01-28 MED ORDER — ESCITALOPRAM OXALATE 10 MG PO TABS: ORAL_TABLET | ORAL | Status: DC

## 2016-01-28 NOTE — Patient Instructions (Signed)
1. Start Lexapro 10mg : Take 1 tablet daily for 2 weeks, then increase to 2 tablets daily 2. Physical exercise and brain stimulation exercises (crossword puzzles, word search, etc) are important for brain health 3. Call our office in a month to update on your condition 4. Follow-up in 6 months
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
## 2016-02-17 ENCOUNTER — Telehealth: Payer: Self-pay | Admitting: Neurology

## 2016-02-17 NOTE — Telephone Encounter (Signed)
I spoke with patient at his last ov he was started on Lexapro 10 mg with instructions to increase to 20 mg after 2 weeks. He states that since starting the medication he feels "drugged", just drowsy all the time. States he's tried taking medication as night and still has the same effect the next day. Also states since starting the medication he has had some indigestion issues. He has still been taking the 10 mg dose, hasn't increased. Please advise.

## 2016-02-17 NOTE — Telephone Encounter (Signed)
Lmovm to rtn my call. 

## 2016-02-17 NOTE — Telephone Encounter (Signed)
PT called and wanted to update on his medication Dr Delice Lesch prescribed/Dawn CB# [phone] or [phone]

## 2016-02-17 NOTE — Telephone Encounter (Signed)
He may stop Lexapro.  Instead, he can try sertaline.  Start 25mg  at bedtime for 7 days, then increase to 50mg  at bedtime

## 2016-02-18 MED ORDER — SERTRALINE HCL 25 MG PO TABS: ORAL_TABLET | ORAL | Status: DC

## 2016-02-18 NOTE — Telephone Encounter (Signed)
Patient returned my call. Notified him of advisement. He is agreeable to try sertraline he will stop the Lexapro. Will send new Rx to his pharmacy. Advised him to call us with any questions or concerns with new med.

## 2016-03-07 DIAGNOSIS — H00016 Hordeolum externum left eye, unspecified eyelid: Secondary | ICD-10-CM | POA: Diagnosis not present

## 2016-05-11 DIAGNOSIS — J029 Acute pharyngitis, unspecified: Secondary | ICD-10-CM | POA: Diagnosis not present

## 2016-05-13 DIAGNOSIS — K148 Other diseases of tongue: Secondary | ICD-10-CM | POA: Diagnosis not present

## 2016-05-13 DIAGNOSIS — J029 Acute pharyngitis, unspecified: Secondary | ICD-10-CM | POA: Diagnosis not present

## 2016-07-10 DIAGNOSIS — C61 Malignant neoplasm of prostate: Secondary | ICD-10-CM | POA: Diagnosis not present

## 2016-07-17 DIAGNOSIS — N5201 Erectile dysfunction due to arterial insufficiency: Secondary | ICD-10-CM | POA: Diagnosis not present

## 2016-07-17 DIAGNOSIS — Z8546 Personal history of malignant neoplasm of prostate: Secondary | ICD-10-CM | POA: Diagnosis not present

## 2016-08-04 ENCOUNTER — Encounter: Payer: Self-pay | Admitting: Neurology

## 2016-08-04 ENCOUNTER — Ambulatory Visit (INDEPENDENT_AMBULATORY_CARE_PROVIDER_SITE_OTHER): Payer: BLUE CROSS/BLUE SHIELD | Admitting: Neurology

## 2016-08-04 VITALS — BP 126/78 | HR 78 | Temp 98.4°F | Ht 71.0 in | Wt 274.1 lb

## 2016-08-04 DIAGNOSIS — F411 Generalized anxiety disorder: Secondary | ICD-10-CM | POA: Diagnosis not present

## 2016-08-04 DIAGNOSIS — F0781 Postconcussional syndrome: Secondary | ICD-10-CM

## 2016-08-04 MED ORDER — SERTRALINE HCL 25 MG PO TABS: ORAL_TABLET | ORAL | 11 refills | Status: AC

## 2016-08-04 NOTE — Patient Instructions (Signed)
1. Reduce Sertraline: Sertraline 25mg  1/2 tablet daily 2. Follow-up in 6 months, call for any changes
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
## 2016-08-06 ENCOUNTER — Encounter: Payer: Self-pay | Admitting: Neurology

## 2016-08-26 DIAGNOSIS — G4733 Obstructive sleep apnea (adult) (pediatric): Secondary | ICD-10-CM | POA: Diagnosis not present

## 2016-09-09 DIAGNOSIS — G4733 Obstructive sleep apnea (adult) (pediatric): Secondary | ICD-10-CM | POA: Diagnosis not present

## 2017-02-10 ENCOUNTER — Ambulatory Visit: Payer: BLUE CROSS/BLUE SHIELD | Admitting: Neurology

## 2017-02-24 ENCOUNTER — Encounter: Payer: Self-pay | Admitting: Neurology

## 2017-02-24 ENCOUNTER — Ambulatory Visit (INDEPENDENT_AMBULATORY_CARE_PROVIDER_SITE_OTHER): Payer: BLUE CROSS/BLUE SHIELD | Admitting: Neurology

## 2017-02-24 VITALS — BP 138/72 | HR 87 | Temp 98.1°F | Ht 72.0 in | Wt 268.0 lb

## 2017-02-24 DIAGNOSIS — F0781 Postconcussional syndrome: Secondary | ICD-10-CM
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
## 2017-02-24 NOTE — Patient Instructions (Signed)
Looking great! Have a safe trip. Follow-up on as needed basis, call for any changes.

## 2017-06-22 DIAGNOSIS — M67911 Unspecified disorder of synovium and tendon, right shoulder: Secondary | ICD-10-CM | POA: Diagnosis not present

## 2017-07-06 DIAGNOSIS — M67911 Unspecified disorder of synovium and tendon, right shoulder: Secondary | ICD-10-CM | POA: Diagnosis not present

## 2017-07-16 DIAGNOSIS — Z8546 Personal history of malignant neoplasm of prostate: Secondary | ICD-10-CM | POA: Diagnosis not present

## 2017-07-20 DIAGNOSIS — N5201 Erectile dysfunction due to arterial insufficiency: Secondary | ICD-10-CM | POA: Diagnosis not present

## 2017-07-20 DIAGNOSIS — Z8546 Personal history of malignant neoplasm of prostate: Secondary | ICD-10-CM | POA: Diagnosis not present

## 2017-08-18 DIAGNOSIS — M25562 Pain in left knee: Secondary | ICD-10-CM | POA: Diagnosis not present

## 2018-03-03 DIAGNOSIS — J069 Acute upper respiratory infection, unspecified: Secondary | ICD-10-CM | POA: Diagnosis not present

## 2018-03-03 DIAGNOSIS — J309 Allergic rhinitis, unspecified: Secondary | ICD-10-CM | POA: Diagnosis not present

## 2018-07-15 DIAGNOSIS — Z8546 Personal history of malignant neoplasm of prostate: Secondary | ICD-10-CM | POA: Diagnosis not present

## 2018-07-22 DIAGNOSIS — N5201 Erectile dysfunction due to arterial insufficiency: Secondary | ICD-10-CM | POA: Diagnosis not present

## 2018-07-22 DIAGNOSIS — Z8546 Personal history of malignant neoplasm of prostate: Secondary | ICD-10-CM | POA: Diagnosis not present

## 2018-09-26 DIAGNOSIS — G4733 Obstructive sleep apnea (adult) (pediatric): Secondary | ICD-10-CM | POA: Diagnosis not present

## 2018-10-06 DIAGNOSIS — G4733 Obstructive sleep apnea (adult) (pediatric): Secondary | ICD-10-CM | POA: Diagnosis not present

## 2019-04-09 DIAGNOSIS — H6092 Unspecified otitis externa, left ear: Secondary | ICD-10-CM | POA: Diagnosis not present

## 2019-04-11 DIAGNOSIS — H6692 Otitis media, unspecified, left ear: Secondary | ICD-10-CM | POA: Diagnosis not present

## 2019-04-11 DIAGNOSIS — L089 Local infection of the skin and subcutaneous tissue, unspecified: Secondary | ICD-10-CM | POA: Diagnosis not present

## 2019-04-14 DIAGNOSIS — H9192 Unspecified hearing loss, left ear: Secondary | ICD-10-CM | POA: Diagnosis not present

## 2019-04-14 DIAGNOSIS — H9209 Otalgia, unspecified ear: Secondary | ICD-10-CM | POA: Diagnosis not present

## 2019-04-14 DIAGNOSIS — H906 Mixed conductive and sensorineural hearing loss, bilateral: Secondary | ICD-10-CM | POA: Diagnosis not present

## 2019-04-14 DIAGNOSIS — H60332 Swimmer's ear, left ear: Secondary | ICD-10-CM | POA: Diagnosis not present

## 2019-05-17 DIAGNOSIS — G4733 Obstructive sleep apnea (adult) (pediatric): Secondary | ICD-10-CM | POA: Diagnosis not present

## 2019-08-02 DIAGNOSIS — Z20828 Contact with and (suspected) exposure to other viral communicable diseases: Secondary | ICD-10-CM | POA: Diagnosis not present

## 2019-08-03 DIAGNOSIS — G4733 Obstructive sleep apnea (adult) (pediatric): Secondary | ICD-10-CM | POA: Diagnosis not present

## 2019-08-29 ENCOUNTER — Other Ambulatory Visit: Payer: Self-pay

## 2019-08-29 DIAGNOSIS — Z20822 Contact with and (suspected) exposure to covid-19: Secondary | ICD-10-CM

## 2019-08-31 LAB — NOVEL CORONAVIRUS, NAA: SARS-CoV-2, NAA: DETECTED — AB

## 2019-10-03 DIAGNOSIS — R251 Tremor, unspecified: Secondary | ICD-10-CM | POA: Diagnosis not present

## 2019-10-03 DIAGNOSIS — Z23 Encounter for immunization: Secondary | ICD-10-CM | POA: Diagnosis not present

## 2019-10-03 DIAGNOSIS — F5101 Primary insomnia: Secondary | ICD-10-CM | POA: Diagnosis not present

## 2019-10-03 DIAGNOSIS — Z8546 Personal history of malignant neoplasm of prostate: Secondary | ICD-10-CM | POA: Diagnosis not present

## 2019-10-03 DIAGNOSIS — Z0000 Encounter for general adult medical examination without abnormal findings: Secondary | ICD-10-CM | POA: Diagnosis not present

## 2019-10-25 DIAGNOSIS — H2513 Age-related nuclear cataract, bilateral: Secondary | ICD-10-CM | POA: Diagnosis not present

## 2019-11-02 DIAGNOSIS — G4733 Obstructive sleep apnea (adult) (pediatric): Secondary | ICD-10-CM | POA: Diagnosis not present

## 2019-11-14 DIAGNOSIS — R748 Abnormal levels of other serum enzymes: Secondary | ICD-10-CM | POA: Diagnosis not present

## 2019-12-05 DIAGNOSIS — R739 Hyperglycemia, unspecified: Secondary | ICD-10-CM | POA: Diagnosis not present

## 2019-12-15 DIAGNOSIS — E119 Type 2 diabetes mellitus without complications: Secondary | ICD-10-CM | POA: Diagnosis not present

## 2019-12-15 DIAGNOSIS — Z1211 Encounter for screening for malignant neoplasm of colon: Secondary | ICD-10-CM | POA: Diagnosis not present

## 2019-12-15 DIAGNOSIS — R1011 Right upper quadrant pain: Secondary | ICD-10-CM | POA: Diagnosis not present

## 2019-12-18 ENCOUNTER — Other Ambulatory Visit: Payer: Self-pay | Admitting: Family Medicine

## 2019-12-18 DIAGNOSIS — R1011 Right upper quadrant pain: Secondary | ICD-10-CM

## 2019-12-26 ENCOUNTER — Encounter: Payer: Self-pay | Admitting: Dietician

## 2019-12-26 ENCOUNTER — Ambulatory Visit
Admission: RE | Admit: 2019-12-26 | Discharge: 2019-12-26 | Disposition: A | Discharge status: Home / Self Care | Payer: BLUE CROSS/BLUE SHIELD | Source: Ambulatory Visit | Attending: Family Medicine | Admitting: Family Medicine

## 2019-12-26 ENCOUNTER — Encounter: Payer: PPO | Attending: Family Medicine | Admitting: Dietician

## 2019-12-26 ENCOUNTER — Other Ambulatory Visit: Payer: Self-pay

## 2019-12-26 DIAGNOSIS — E119 Type 2 diabetes mellitus without complications: Secondary | ICD-10-CM | POA: Diagnosis not present

## 2019-12-26 DIAGNOSIS — R1011 Right upper quadrant pain: Secondary | ICD-10-CM

## 2019-12-26 DIAGNOSIS — K7689 Other specified diseases of liver: Secondary | ICD-10-CM | POA: Diagnosis not present

## 2019-12-26 IMAGING — US US ABDOMEN LIMITED
1 series · 13 of 25 positions shown · non-contrast
Comparison: CT scan from [53]

CLINICAL DATA: Right upper quadrant abdominal pain.

EXAM:
ULTRASOUND ABDOMEN LIMITED RIGHT UPPER QUADRANT

[Series 1: us abdomen limited · 0.30mm/px · 13 of 79 slices shown]
[im 1/79]
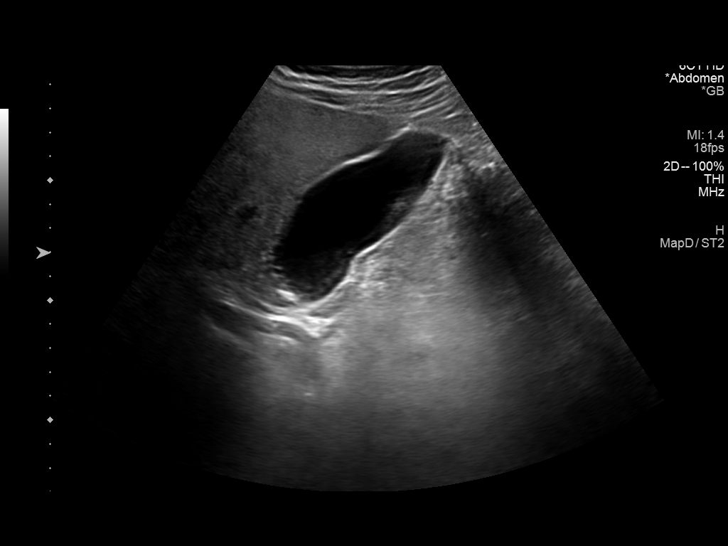
[im 7/79]
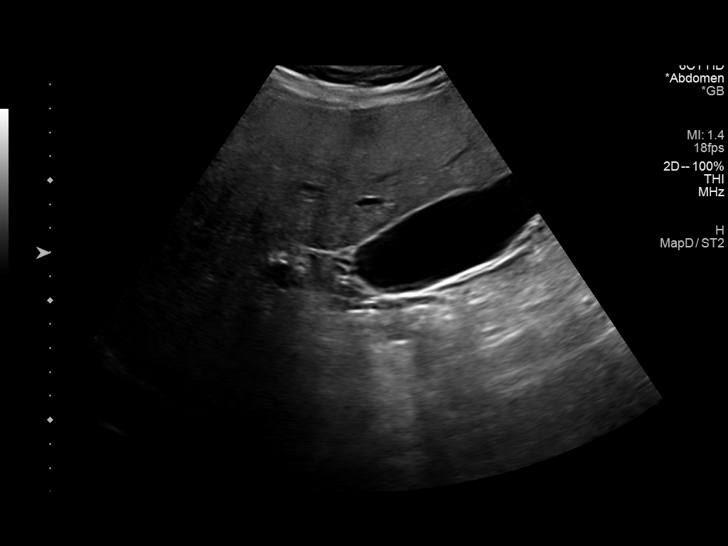
[im 14/79]
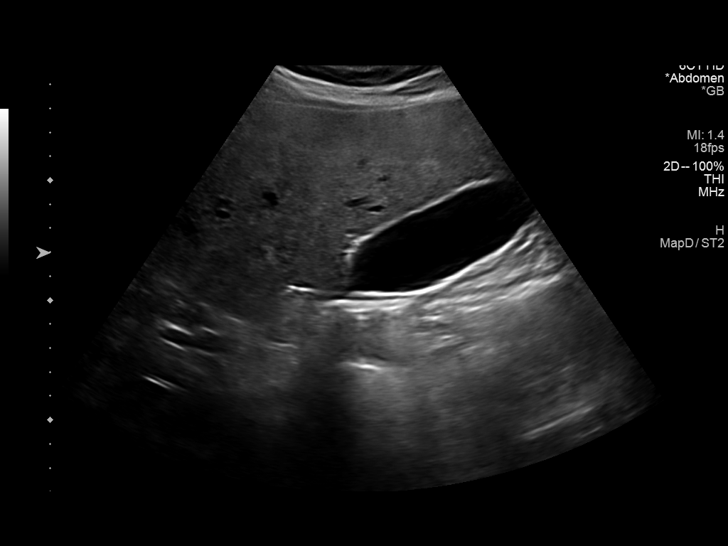
[im 20/79]
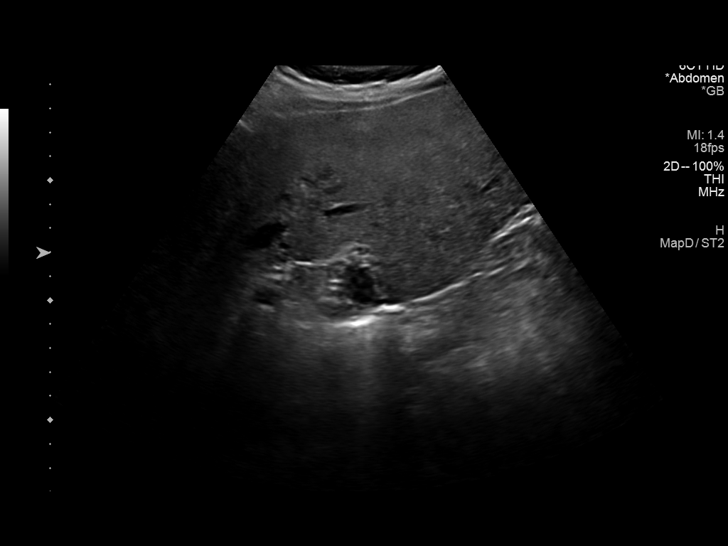
[im 27/79]
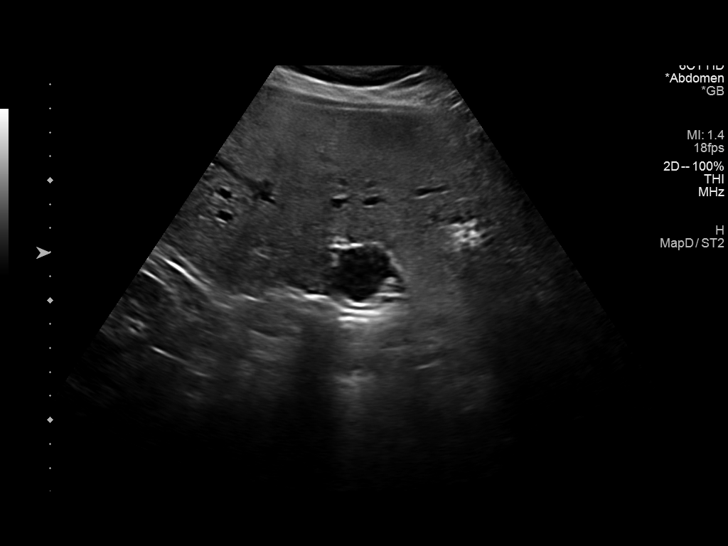
[im 33/79]
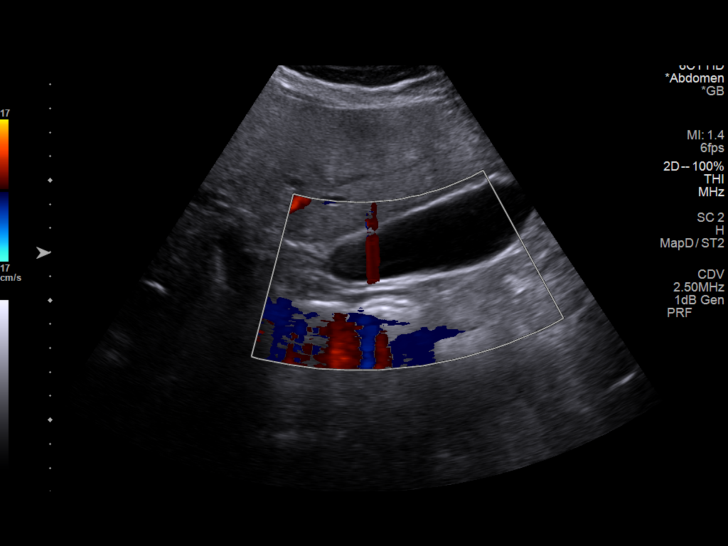
[im 40/79]
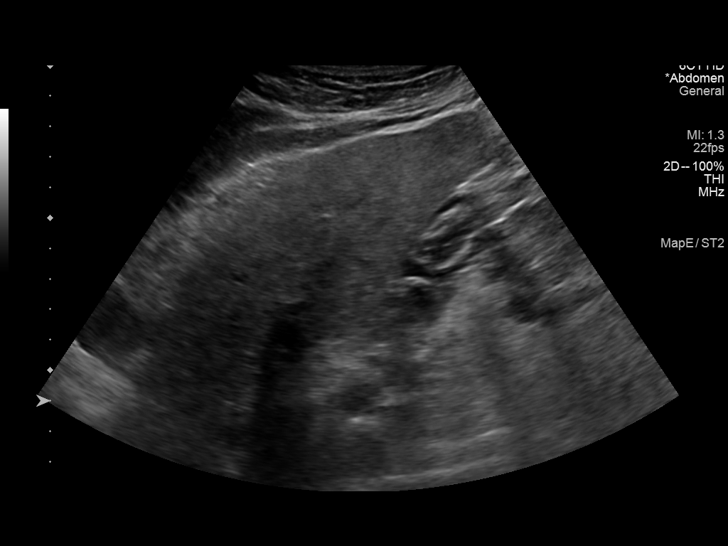
[im 46/79]
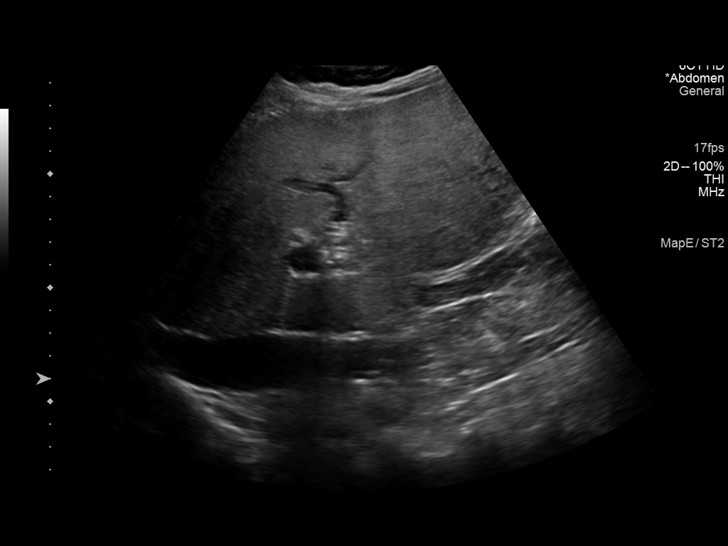
[im 53/79]
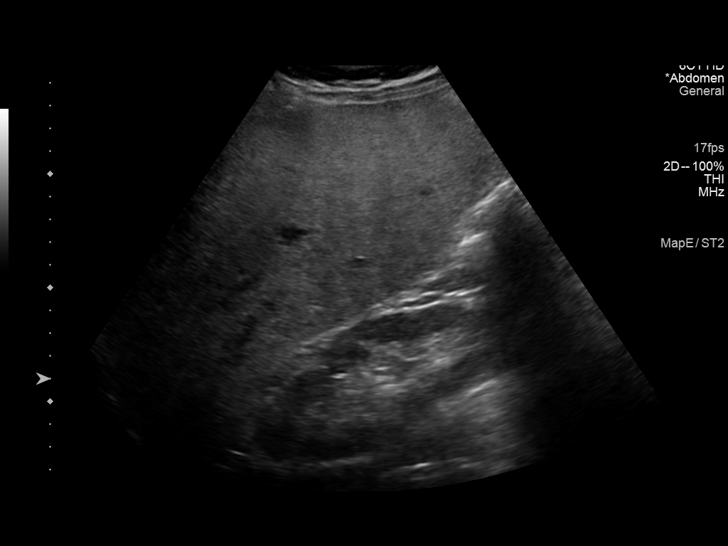
[im 59/79]
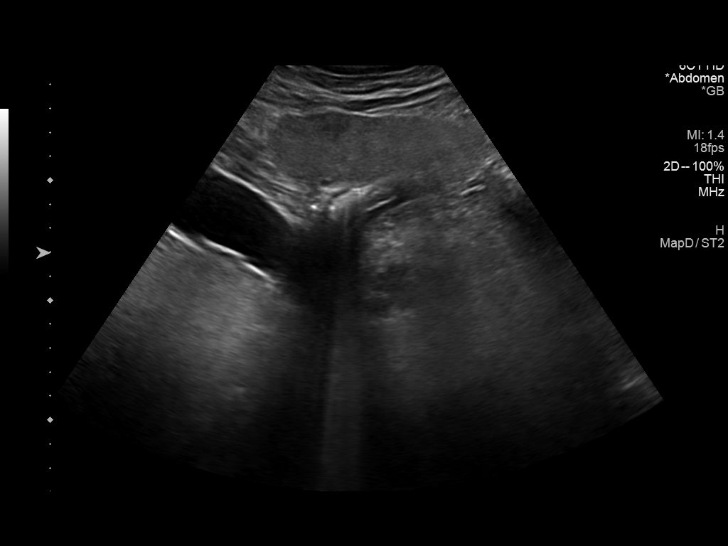
[im 66/79]
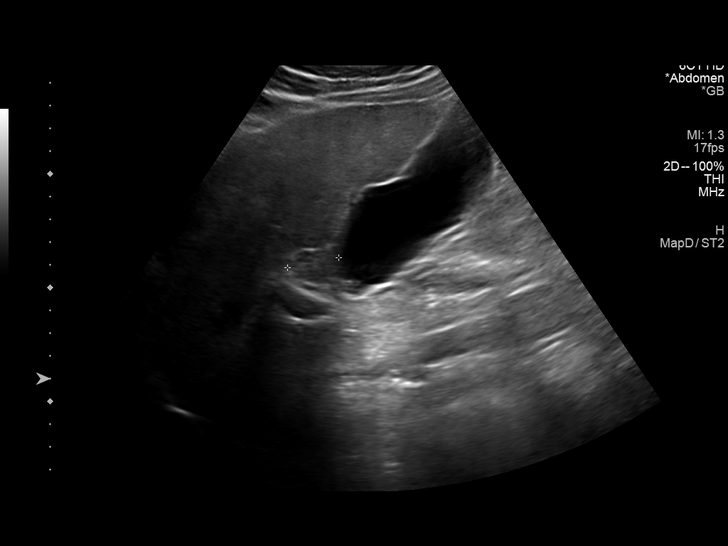
[im 72/79]
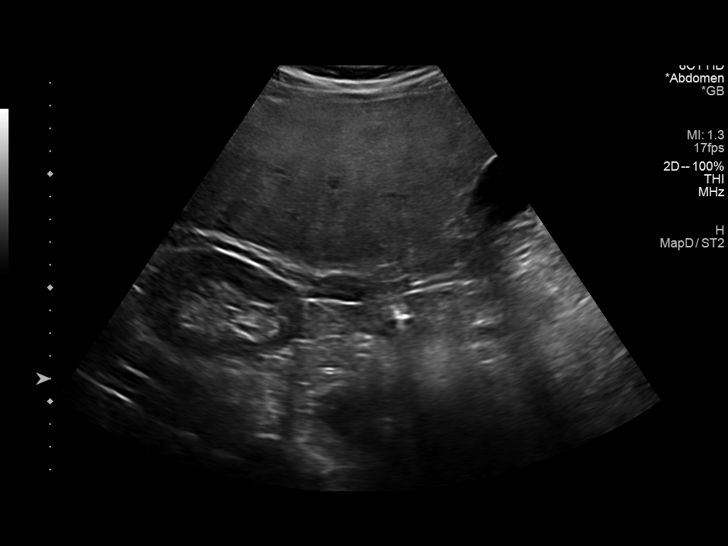
[im 79/79]
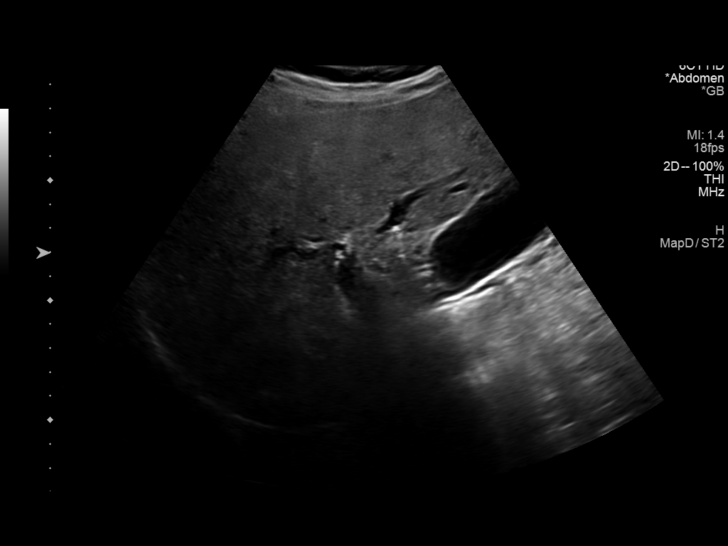

[13 of 25 positions shown; findings below may reference images not displayed]

FINDINGS: Gallbladder:

No gallstones or wall thickening visualized. No sonographic Murphy
sign noted by sonographer.

Common bile duct:

Diameter: 3.0 mm

Liver:

Slightly heterogeneous coarse liver echotexture and slightly
irregular liver contour could suggest changes of cirrhosis. There is
a small slightly hypoechoic lesion involving the left hepatic lobe
anteriorly which measures approximately 2.2 x 1.5 x 2.0 cm. There is
also a suspected second lesion in the caudate lobe near the
gallbladder neck which measures 2.3 x 2.0 x 2.3 cm. These lesions
are indeterminate. Recommend MRI abdomen without and with contrast
for further evaluation. Portal vein is patent on color Doppler
imaging with normal direction of blood flow towards the liver.

Other: None.
IMPRESSION: 1. Slightly irregular liver contour and coarse heterogeneous
echogenicity suggesting cirrhosis.
2. Two indeterminate liver lesions. Recommend follow-up MRI abdomen
without and with contrast for further evaluation.
3. Normal appearance of the gallbladder and normal caliber common
bile duct.

## 2019-12-26 NOTE — Progress Notes (Signed)
Patient was seen on 12/26/19 for the first of a series of three diabetes self-management courses at [facility].  Patient Education Plan per assessed needs and concerns is to attend three course education program for Diabetes Self Management Education.  Patient stated that he has lost from 283 lbs to 272 lbs in the past week.  The following learning objectives were met by the patient during this class:  Describe diabetes, types of diabetes and pathophysiology  State some common risk factors for diabetes  Defines the role of glucose and insulin  Describe the relationship between diabetes and cardiovascular and other risks  State the members of the Healthcare Team  States the rationale for glucose monitoring and when to test  State their individual [employer] the importance of logging glucose readings and how to interpret the readings  Identifies A1C target  Explain the correlation between A1c and eAG values  State symptoms and treatment of high blood glucose and low blood glucose  Explain proper technique for glucose testing and identify proper sharps disposal  Handouts given during class include:  How to Thrive:  A Guide for Your Journey with Diabetes by the ADA  Meal Plan Card and carbohydrate content list  Dietary intake form  Low Sodium Flavoring Tips  Types of Fats  Dining Out  Label reading  Snack list  Planning a balanced meal  The diabetes portion plate  Diabetes Resources  A1c to eAG Conversion Chart  Blood Glucose Log  Diabetes Recommended Care Schedule  Support Group  Diabetes Success Plan  Core Class Satisfaction Survey   Follow-Up Plan:  Attend core 2

## 2020-01-01 ENCOUNTER — Other Ambulatory Visit: Payer: Self-pay | Admitting: Family Medicine

## 2020-01-01 DIAGNOSIS — K7689 Other specified diseases of liver: Secondary | ICD-10-CM

## 2020-01-02 ENCOUNTER — Other Ambulatory Visit: Payer: Self-pay

## 2020-01-02 ENCOUNTER — Encounter: Payer: PPO | Admitting: Dietician

## 2020-01-02 ENCOUNTER — Encounter: Payer: Self-pay | Admitting: Dietician

## 2020-01-02 DIAGNOSIS — E119 Type 2 diabetes mellitus without complications: Secondary | ICD-10-CM

## 2020-01-02 NOTE — Progress Notes (Signed)
Patient was seen on 01/01/2020 for the second of a series of three diabetes self-management courses at the [facility]. The following learning objectives were met by the patient during this class:   Describe the role of different macronutrients on glucose  Explain how carbohydrates affect blood glucose  State what foods contain the most carbohydrates  Demonstrate carbohydrate counting  Demonstrate how to read Nutrition Facts food label  Describe effects of various fats on heart health  Describe the importance of good nutrition for health and healthy eating strategies  Describe techniques for managing your shopping, cooking and meal planning  List strategies to follow meal plan when dining out  Describe the effects of alcohol on glucose and how to use it safely  Goals:  Follow Diabetes Meal Plan as instructed  Aim to spread carbs evenly throughout the day  Aim for 3 meals per day and snacks as needed Include lean protein foods to meals/snacks  Monitor glucose levels as instructed by your doctor   Follow-Up Plan:  Attend Core 3  Work towards following your personal food plan.

## 2020-01-09 ENCOUNTER — Other Ambulatory Visit: Payer: Self-pay

## 2020-01-09 ENCOUNTER — Encounter: Payer: PPO | Admitting: Dietician

## 2020-01-09 DIAGNOSIS — E119 Type 2 diabetes mellitus without complications: Secondary | ICD-10-CM

## 2020-01-11 ENCOUNTER — Encounter: Payer: Self-pay | Admitting: Dietician

## 2020-01-11 NOTE — Progress Notes (Signed)
Patient was seen on 01/09/2020 for the third of a series of three diabetes self-management courses at the [facility].   Catalina Gravel the amount of activity recommended for healthy living . Describe activities suitable for individual needs . Identify ways to regularly incorporate activity into daily life . Identify barriers to activity and ways to over come these barriers  Identify diabetes medications being personally used and their primary action for lowering glucose and possible side effects . Describe role of stress on blood glucose and develop strategies to address psychosocial issues . Identify diabetes complications and ways to prevent them  Explain how to manage diabetes during illness . Evaluate success in meeting personal goal . Establish 2-3 goals that they will plan to diligently work on  Goals:   I will count my carb choices at most meals and snacks  I will be active 30 minutes or more 5 times a week  I will eat less unhealthy fats by eating less red meats  I will test my glucose at least 1 times a day, 7 days a week  I will ltrack food intake and association to glucose levels  Your patient has identified these potential barriers to change:  Motivation  Your patient has identified their diabetes self-care support plan as   [facility] Support Group    Plan:  Attend Support Group as desired

## 2020-01-30 ENCOUNTER — Ambulatory Visit
Admission: RE | Admit: 2020-01-30 | Discharge: 2020-01-30 | Disposition: A | Discharge status: Home / Self Care | Payer: PPO | Source: Ambulatory Visit | Attending: Family Medicine | Admitting: Family Medicine

## 2020-01-30 ENCOUNTER — Other Ambulatory Visit: Payer: Self-pay

## 2020-01-30 DIAGNOSIS — K7689 Other specified diseases of liver: Secondary | ICD-10-CM

## 2020-01-30 IMAGING — MR MR ABDOMEN WO/W CM
11 of 17 series · 25 of 48 positions shown · IV contrast (multihance)
Comparison: Liver ultrasound, [DATE], CT abdomen pelvis,
[DATE]

CLINICAL DATA: Characterize liver lesions

EXAM:
MRI ABDOMEN WITHOUT AND WITH CONTRAST
TECHNIQUE: Multiplanar multisequence MR imaging of the abdomen was performed
both before and after the administration of intravenous contrast.
CONTRAST:  20mL MULTIHANCE GADOBENATE DIMEGLUMINE 529 MG/ML IV SOLN

[Series 3: T2 · coronal · 5.5mm · 1.56mm/px · 1 of 40 slices shown (1 of 3)]
[im 1/40]
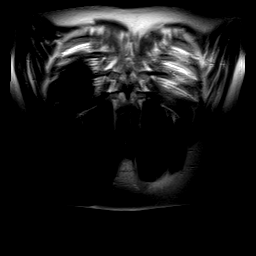

[Series 5: T2 · axial · 5.0mm · 1.37mm/px · 1 of 35 slices shown (2 of 3)]
[im 1/35]
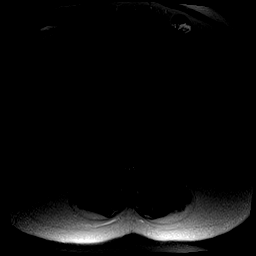

[Series 6: axial tru fisp · axial · 5.0mm · 1.41mm/px · 1 of 37 slices shown]
[im 1/37]
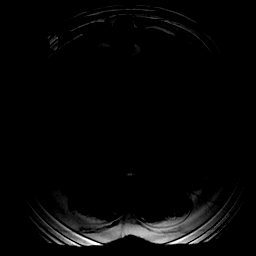

[Series 7: axial in out · axial · 6.0mm · 0.74mm/px · 1 of 60 slices shown]
[im 1/60]
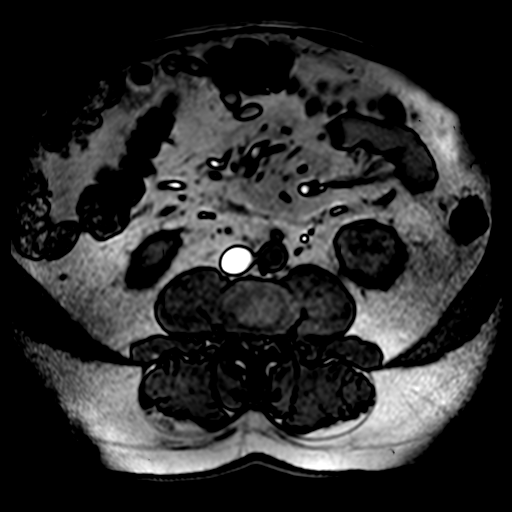

[Series 8: T2 · axial · 6.5mm · 0.75mm/px · 1 of 34 slices shown (3 of 3)]
[im 1/34]
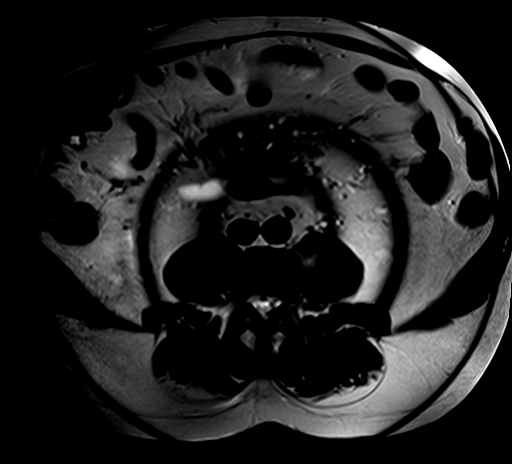

[Series 9: [id number] · axial · 6.0mm · 2.03mm/px · z∈[-87,+139]mm · 3 of 90 slices shown]
[im 1/90]
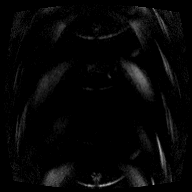
[im 45/90]
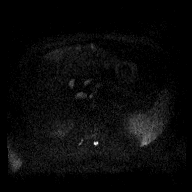
[im 90/90]
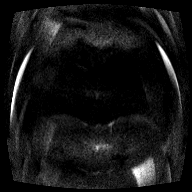

[Series 10: [id number]_adc · axial · 6.0mm · 2.03mm/px · 1 of 30 slices shown]
[im 1/30]
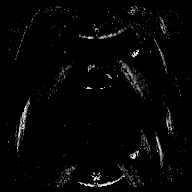

[Series 11: T1 dynamic · axial · non-contrast · 2.2mm · 0.78mm/px · z∈[-117,+92]mm · 4 of 96 slices shown]
[im 1/96]
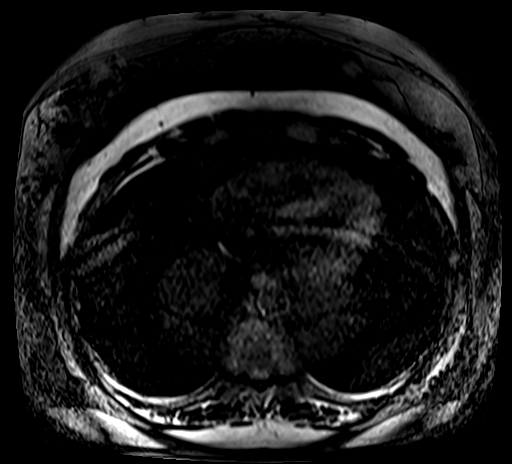
[im 32/96]
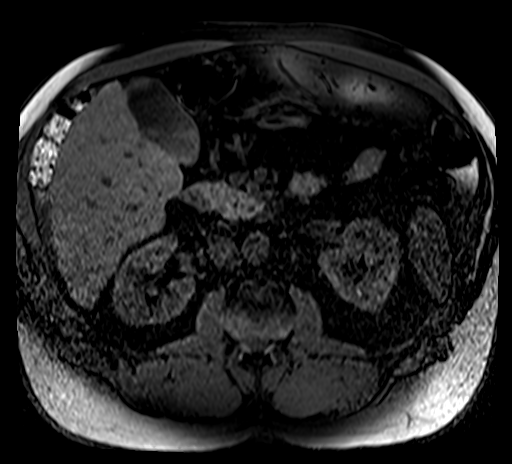
[im 64/96]
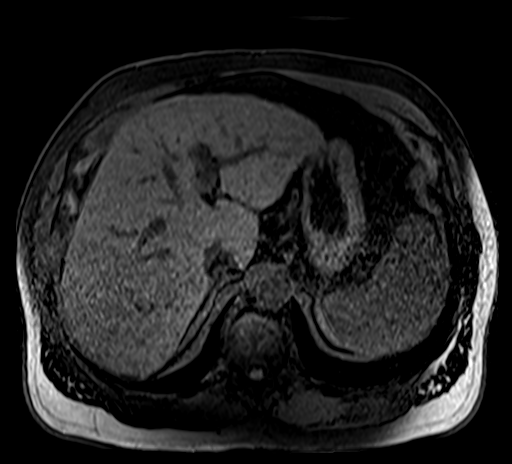
[im 96/96]
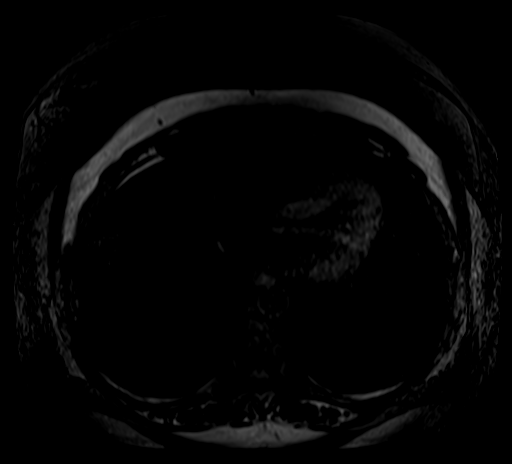

[Series 12: post 25 sec · axial · 2.2mm · 0.78mm/px · z∈[-117,+92]mm · 4 of 96 slices shown]
[im 1/96]
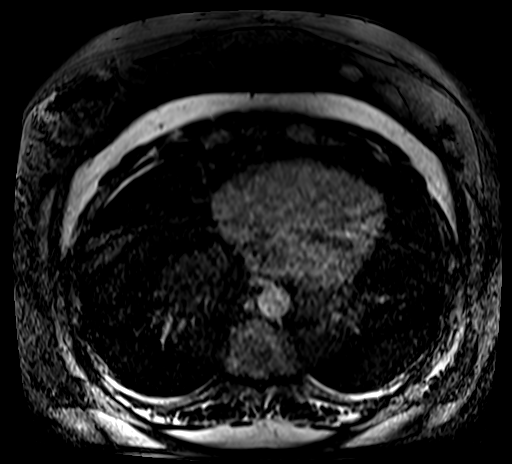
[im 32/96]
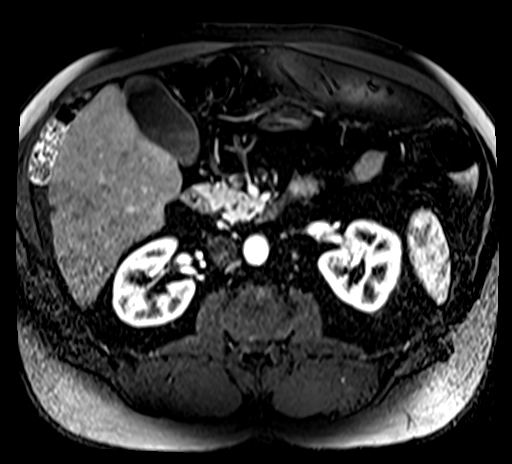
[im 64/96]
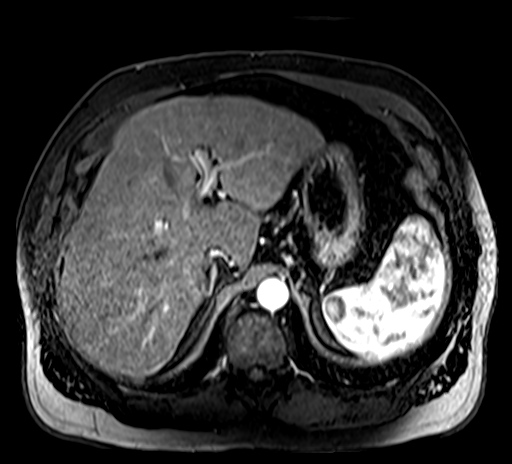
[im 96/96]
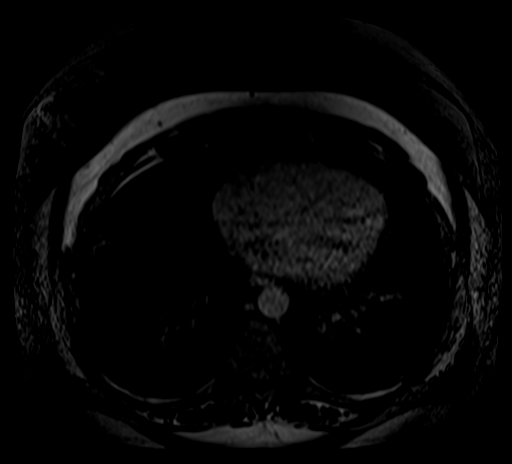

[Series 13: post 25 sec_sub · axial · 2.2mm · 0.78mm/px · z∈[-117,+92]mm · 4 of 96 slices shown]
[im 1/96]
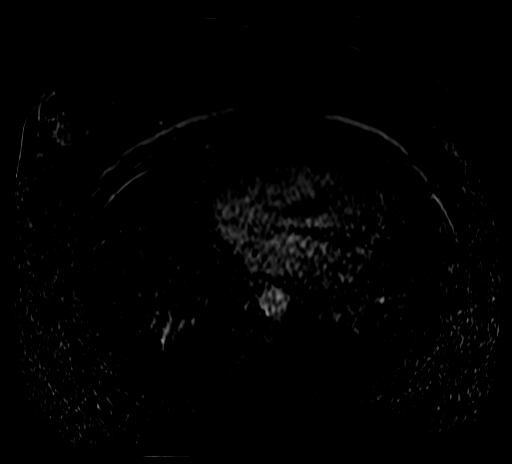
[im 32/96]
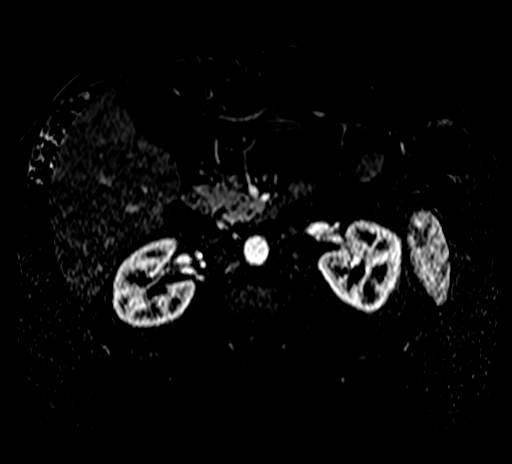
[im 64/96]
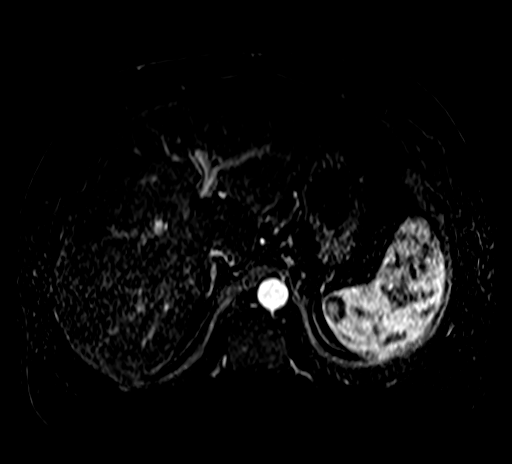
[im 96/96]
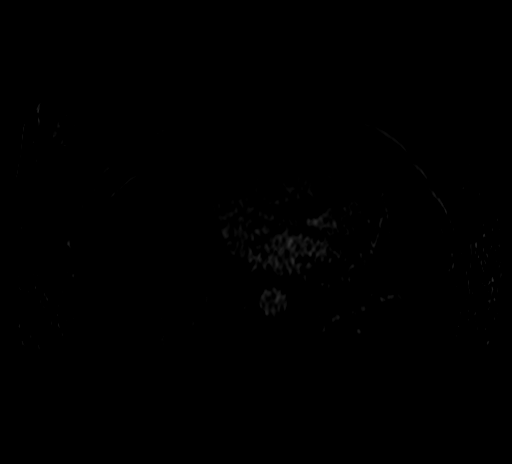

[Series 14: post 45 sec · axial · 2.2mm · 0.78mm/px · z∈[-117,+92]mm · 4 of 96 slices shown]
[im 1/96]
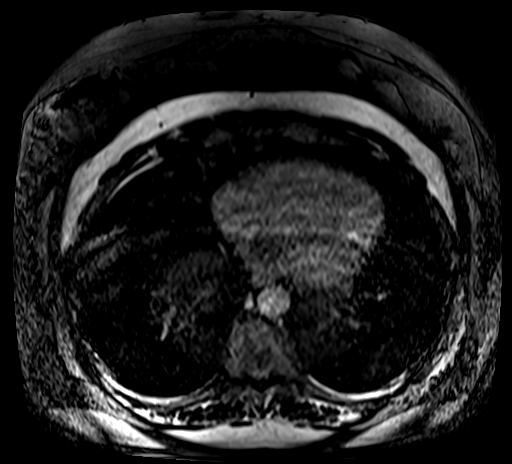
[im 32/96]
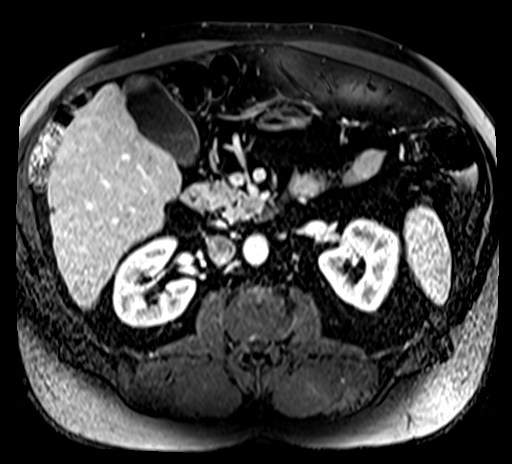
[im 64/96]
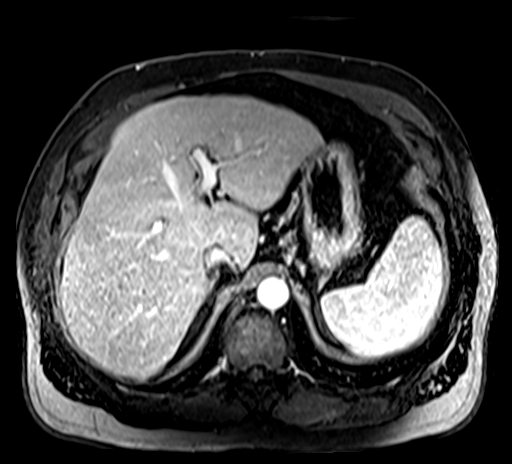
[im 96/96]
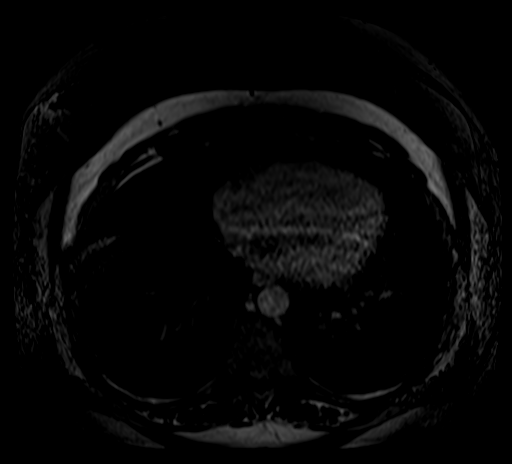

[25 of 48 positions shown; findings below may reference images not displayed]

FINDINGS: Lower chest: No acute findings.

Hepatobiliary: Mildly coarse and nodular contour of the liver with
hypertrophy of the caudate lobe. There is a partially exophytic,
subcapsular lesion of the anterior left lobe of the liver, hepatic
segment III, measuring approximately 1.7 cm, with very subtle
associated intrinsic T1 hyperintensity and very subtle
hypoenhancement on multiphasic imaging. Multiple tiny nonenhancing
fluid signal lesions, too small to characterize although likely
simple cysts or hemangiomata, and additional tiny very low signal
lesions, possibly tiny siderotic nodules.

Pancreas: No mass, inflammatory changes, or other parenchymal
abnormality identified.

Spleen:  Within normal limits in size and appearance.

Adrenals/Urinary Tract: No masses identified. No evidence of
hydronephrosis.

Stomach/Bowel: Visualized portions within the abdomen are
unremarkable.

Vascular/Lymphatic: Prominent porta hepatis lymph nodes. No
abdominal aortic aneurysm demonstrated.

Other:  None.

Musculoskeletal: No suspicious bone lesions identified.
IMPRESSION: 1. Mildly coarse and nodular contour of the liver with hypertrophy
of the caudate, morphologic stigmata suggesting cirrhosis. Correlate
with biochemical findings and history of high risk liver disease.

2. There is a partially exophytic, subcapsular lesion of the
anterior left lobe of the liver, hepatic segment III, measuring
approximately 1.7 cm, with very subtle associated intrinsic T1
hyperintensity and very subtle hypoenhancement on multiphasic
imaging, corresponding to finding of prior ultrasound. Of note, this
lesion is new when compared to prior CT dated [DATE]. This is an
intermediate probability lesion for hepatocellular carcinoma,
JAKUBOWSKI category 3 if characterized by JAKUBOWSKI criteria. Recommend
follow-up examination in 3-6 months to assess for stability.

3. No discrete MR abnormality to correspond to ultrasound queried
abnormality in the caudate; as noted above, there is hypertrophy of
the caudate suggestive of cirrhosis.

## 2020-01-30 MED ORDER — GADOBENATE DIMEGLUMINE 529 MG/ML IV SOLN
20.0000 mL | Freq: Once | INTRAVENOUS | Status: AC | PRN
  Administered 2020-01-30: 08:00:00 20 mL via INTRAVENOUS

## 2020-02-15 DIAGNOSIS — Z86010 Personal history of colonic polyps: Secondary | ICD-10-CM | POA: Diagnosis not present

## 2020-02-15 DIAGNOSIS — K7460 Unspecified cirrhosis of liver: Secondary | ICD-10-CM | POA: Diagnosis not present

## 2020-02-15 DIAGNOSIS — K769 Liver disease, unspecified: Secondary | ICD-10-CM | POA: Diagnosis not present

## 2020-02-23 ENCOUNTER — Ambulatory Visit: Payer: PPO | Attending: Internal Medicine

## 2020-02-23 DIAGNOSIS — Z23 Encounter for immunization: Secondary | ICD-10-CM

## 2020-02-23 NOTE — Progress Notes (Signed)
   Covid-19 Vaccination Clinic  Name:  Luke Nelson    MRN: [id number] DOB: [birth year]  02/23/2020  [name] was observed post Covid-19 immunization for 15 minutes without incident. He was provided with Vaccine Information Sheet and instruction to access the V-Safe system.   [name] was instructed to call 911 with any severe reactions post vaccine: Marland Kitchen Difficulty breathing  . Swelling of face and throat  . A fast heartbeat  . A bad rash all over body  . Dizziness and weakness   Immunizations Administered    Name Date Dose VIS Date Route   Pfizer COVID-19 Vaccine 02/23/2020  3:06 PM 0.3 mL 12/20/2018 Intramuscular   Manufacturer: [place]   Lot: [id number]   [place]: [id number]

## 2020-03-13 DIAGNOSIS — E119 Type 2 diabetes mellitus without complications: Secondary | ICD-10-CM | POA: Diagnosis not present

## 2020-03-18 ENCOUNTER — Ambulatory Visit: Payer: PPO | Attending: Internal Medicine

## 2020-03-18 DIAGNOSIS — Z23 Encounter for immunization: Secondary | ICD-10-CM

## 2020-03-18 NOTE — Progress Notes (Signed)
   Covid-19 Vaccination Clinic  Name:  Luke Nelson    MRN: [id number] DOB: Dec 30, 1950  03/18/2020  [name] was observed post Covid-19 immunization for 15 minutes without incident. He was provided with Vaccine Information Sheet and instruction to access the V-Safe system.   [name] was instructed to call 911 with any severe reactions post vaccine: Marland Kitchen Difficulty breathing  . Swelling of face and throat  . A fast heartbeat  . A bad rash all over body  . Dizziness and weakness   Immunizations Administered    Name Date Dose VIS Date Route   Pfizer COVID-19 Vaccine 03/18/2020 10:00 AM 0.3 mL 12/20/2018 Intramuscular   Manufacturer: [place]   Lot: [id number]   [place]: [id number]

## 2020-03-26 DIAGNOSIS — Z1159 Encounter for screening for other viral diseases: Secondary | ICD-10-CM | POA: Diagnosis not present

## 2020-03-28 DIAGNOSIS — D125 Benign neoplasm of sigmoid colon: Secondary | ICD-10-CM | POA: Diagnosis not present

## 2020-03-28 DIAGNOSIS — D124 Benign neoplasm of descending colon: Secondary | ICD-10-CM | POA: Diagnosis not present

## 2020-03-28 DIAGNOSIS — K317 Polyp of stomach and duodenum: Secondary | ICD-10-CM | POA: Diagnosis not present

## 2020-03-28 DIAGNOSIS — D123 Benign neoplasm of transverse colon: Secondary | ICD-10-CM | POA: Diagnosis not present

## 2020-03-28 DIAGNOSIS — K635 Polyp of colon: Secondary | ICD-10-CM | POA: Diagnosis not present

## 2020-03-28 DIAGNOSIS — Z86010 Personal history of colonic polyps: Secondary | ICD-10-CM | POA: Diagnosis not present

## 2020-03-28 DIAGNOSIS — K7460 Unspecified cirrhosis of liver: Secondary | ICD-10-CM | POA: Diagnosis not present

## 2020-03-28 DIAGNOSIS — K2970 Gastritis, unspecified, without bleeding: Secondary | ICD-10-CM | POA: Diagnosis not present

## 2020-03-28 DIAGNOSIS — K2930 Chronic superficial gastritis without bleeding: Secondary | ICD-10-CM | POA: Diagnosis not present

## 2020-03-28 DIAGNOSIS — K228 Other specified diseases of esophagus: Secondary | ICD-10-CM | POA: Diagnosis not present

## 2020-04-03 DIAGNOSIS — D124 Benign neoplasm of descending colon: Secondary | ICD-10-CM | POA: Diagnosis not present

## 2020-04-03 DIAGNOSIS — K2930 Chronic superficial gastritis without bleeding: Secondary | ICD-10-CM | POA: Diagnosis not present

## 2020-04-03 DIAGNOSIS — K317 Polyp of stomach and duodenum: Secondary | ICD-10-CM | POA: Diagnosis not present

## 2020-04-03 DIAGNOSIS — D125 Benign neoplasm of sigmoid colon: Secondary | ICD-10-CM | POA: Diagnosis not present

## 2020-04-03 DIAGNOSIS — D123 Benign neoplasm of transverse colon: Secondary | ICD-10-CM | POA: Diagnosis not present

## 2020-04-03 DIAGNOSIS — K635 Polyp of colon: Secondary | ICD-10-CM | POA: Diagnosis not present

## 2020-05-23 ENCOUNTER — Other Ambulatory Visit: Payer: Self-pay | Admitting: Gastroenterology

## 2020-05-23 DIAGNOSIS — K769 Liver disease, unspecified: Secondary | ICD-10-CM

## 2020-05-23 DIAGNOSIS — Z86010 Personal history of colonic polyps: Secondary | ICD-10-CM | POA: Diagnosis not present

## 2020-05-23 DIAGNOSIS — K7460 Unspecified cirrhosis of liver: Secondary | ICD-10-CM | POA: Diagnosis not present

## 2020-05-23 DIAGNOSIS — R7989 Other specified abnormal findings of blood chemistry: Secondary | ICD-10-CM | POA: Diagnosis not present

## 2020-07-09 ENCOUNTER — Other Ambulatory Visit: Payer: PPO

## 2020-07-30 ENCOUNTER — Ambulatory Visit
Admission: RE | Admit: 2020-07-30 | Discharge: 2020-07-30 | Disposition: A | Discharge status: Home / Self Care | Payer: PPO | Source: Ambulatory Visit | Attending: Gastroenterology | Admitting: Gastroenterology

## 2020-07-30 DIAGNOSIS — Z8546 Personal history of malignant neoplasm of prostate: Secondary | ICD-10-CM | POA: Diagnosis not present

## 2020-07-30 DIAGNOSIS — K769 Liver disease, unspecified: Secondary | ICD-10-CM

## 2020-07-30 DIAGNOSIS — R160 Hepatomegaly, not elsewhere classified: Secondary | ICD-10-CM | POA: Diagnosis not present

## 2020-07-30 DIAGNOSIS — R590 Localized enlarged lymph nodes: Secondary | ICD-10-CM | POA: Diagnosis not present

## 2020-07-30 DIAGNOSIS — K7689 Other specified diseases of liver: Secondary | ICD-10-CM | POA: Diagnosis not present

## 2020-07-30 IMAGING — MR MR ABDOMEN WO/W CM
17 series · 48 of 48 positions shown · IV contrast (20 ML MULTIHANCE)
Comparison: [DATE] MRI abdomen.

CLINICAL DATA: Follow-up liver mass.  History of prostate cancer.

EXAM:
MRI ABDOMEN WITHOUT AND WITH CONTRAST
TECHNIQUE: Multiplanar multisequence MR imaging of the abdomen was performed
both before and after the administration of intravenous contrast.
CONTRAST:  20mL MULTIHANCE GADOBENATE DIMEGLUMINE 529 MG/ML IV SOLN

[Series 3: T2 · coronal · 5.0mm · 1.56mm/px · 1 of 44 slices shown (1 of 3)]
[im 1/44]
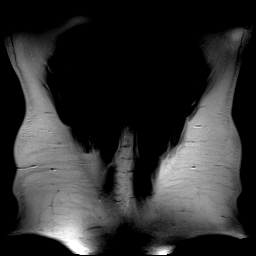

[Series 4: T1 · axial · 3.0mm · 1.20mm/px · z∈[-119,+94]mm · 5 of 144 slices shown]
[im 1/144]
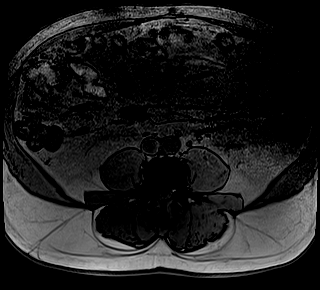
[im 36/144]
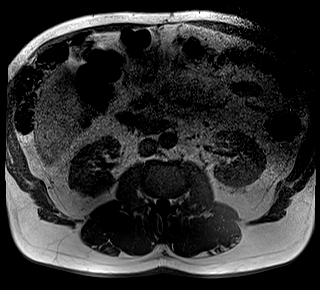
[im 72/144]
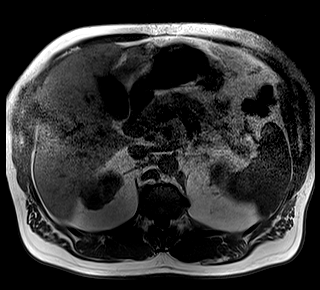
[im 108/144]
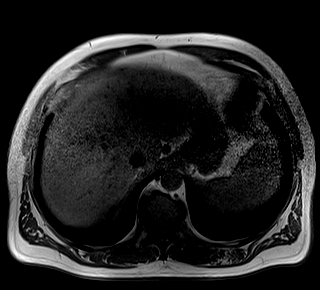
[im 144/144]
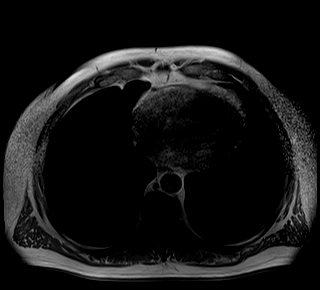

[Series 5: bSSFP · axial · 5.0mm · 1.25mm/px · 1 of 38 slices shown]
[im 1/38]
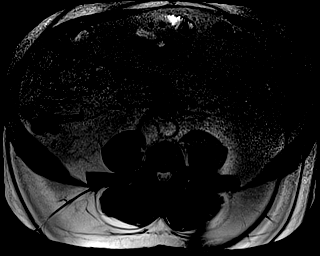

[Series 6: T2 · axial · 5.0mm · 1.50mm/px · z∈[-82,+140]mm · 2 of 38 slices shown (2 of 3)]
[im 1/38]
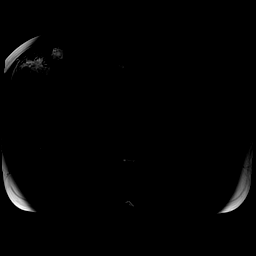
[im 38/38]
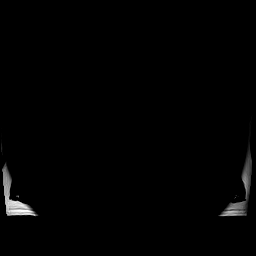

[Series 7: DWI · axial · 5.0mm · 1.42mm/px · z∈[-100,+134]mm · 5 of 120 slices shown (1 of 2)]
[im 1/120]
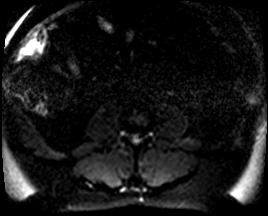
[im 30/120]
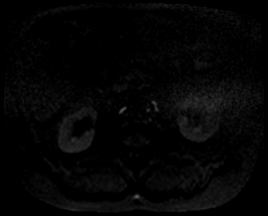
[im 60/120]
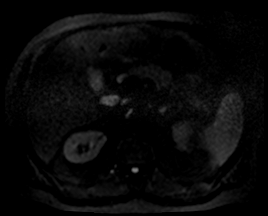
[im 90/120]
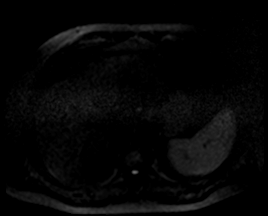
[im 120/120]
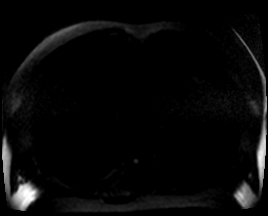

[Series 8: DWI · axial · 5.0mm · 1.42mm/px · z∈[-100,+134]mm · 2 of 40 slices shown (2 of 2)]
[im 1/40]
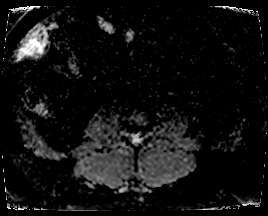
[im 40/40]
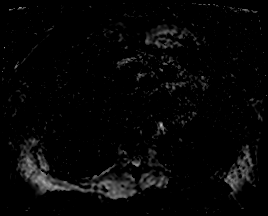

[Series 9: T2 · axial · 6.0mm · 1.19mm/px · 1 of 30 slices shown (3 of 3)]
[im 1/30]
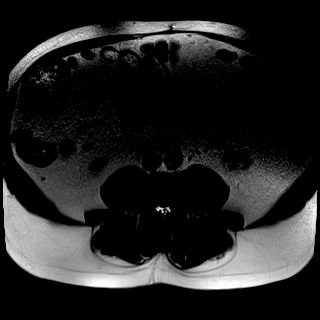

[Series 10: T1 dynamic · axial · non-contrast · 3.0mm · 1.25mm/px · z∈[-119,+94]mm · 3 of 72 slices shown]
[im 1/72]
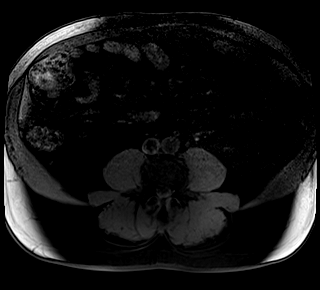
[im 36/72]
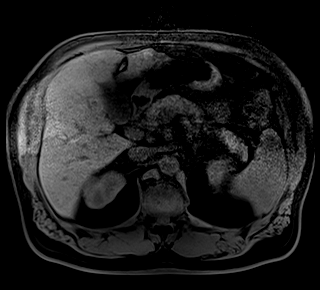
[im 72/72]
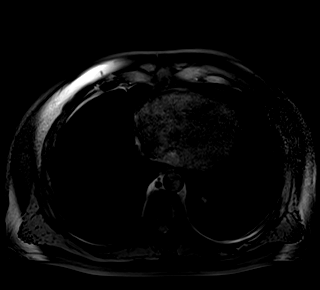

[Series 11: T1 dynamic post-contrast · axial · 3.0mm · 1.25mm/px · z∈[-119,+94]mm · 3 of 72 slices shown (1 of 9)]
[im 1/72]
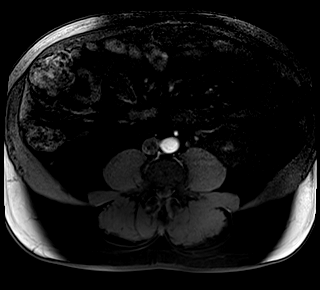
[im 36/72]
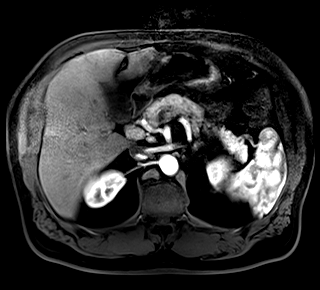
[im 72/72]
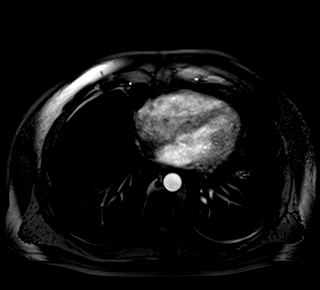

[Series 12: T1 dynamic post-contrast · axial · 3.0mm · 1.25mm/px · z∈[-119,+94]mm · 3 of 72 slices shown (2 of 9)]
[im 1/72]
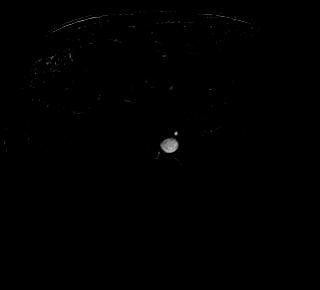
[im 36/72]
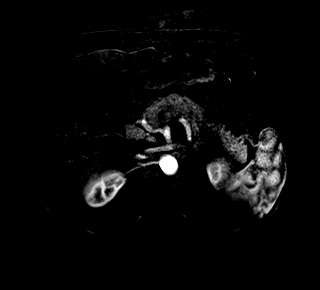
[im 72/72]
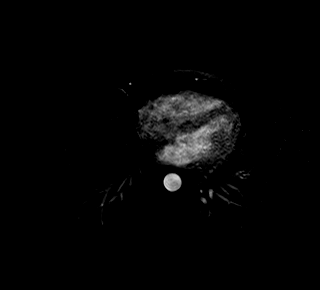

[Series 13: T1 dynamic post-contrast · axial · 3.0mm · 1.25mm/px · z∈[-119,+94]mm · 3 of 72 slices shown (3 of 9)]
[im 1/72]
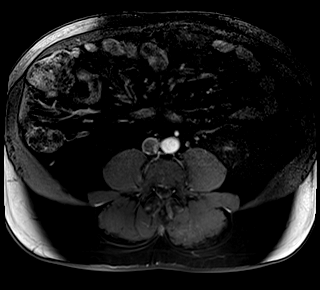
[im 36/72]
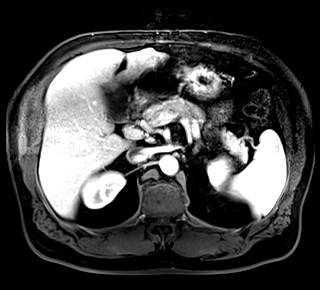
[im 72/72]
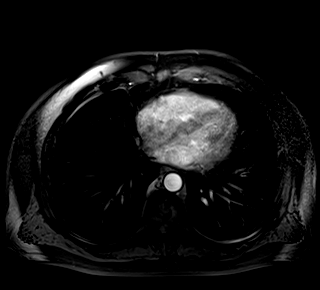

[Series 14: T1 dynamic post-contrast · axial · 3.0mm · 1.25mm/px · z∈[-119,+94]mm · 3 of 72 slices shown (4 of 9)]
[im 1/72]
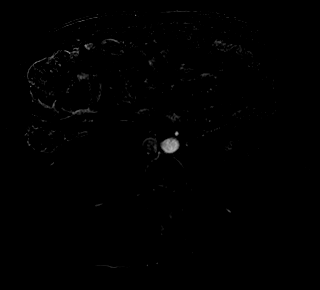
[im 36/72]
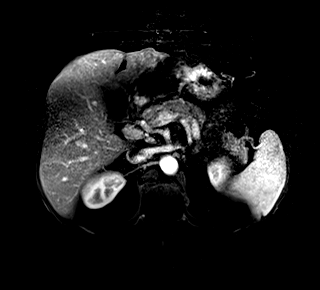
[im 72/72]
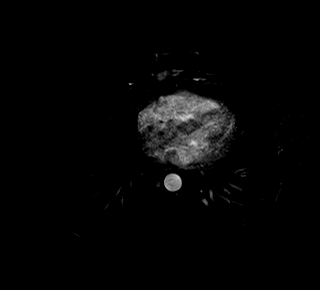

[Series 15: T1 dynamic post-contrast · axial · 3.0mm · 1.25mm/px · z∈[-119,+94]mm · 3 of 72 slices shown (5 of 9)]
[im 1/72]
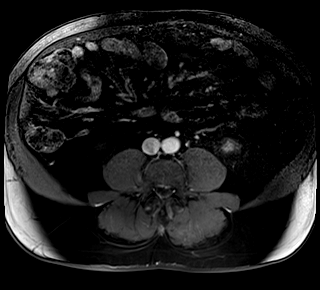
[im 36/72]
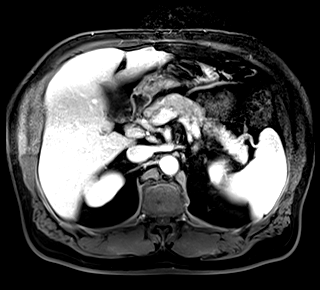
[im 72/72]
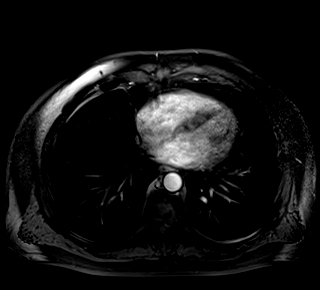

[Series 16: T1 dynamic post-contrast · axial · 3.0mm · 1.25mm/px · z∈[-119,+94]mm · 3 of 72 slices shown (6 of 9)]
[im 1/72]
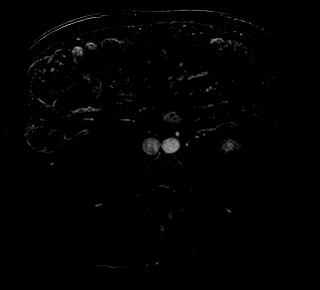
[im 36/72]
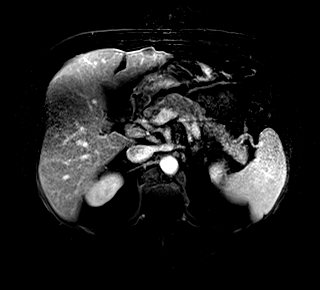
[im 72/72]
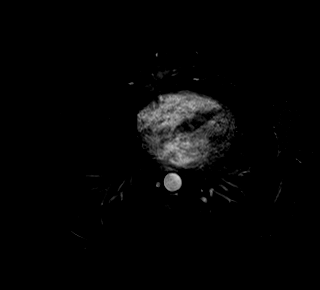

[Series 17: T1 dynamic post-contrast · coronal · 3.0mm · 1.25mm/px · 4 of 88 slices shown (7 of 9)]
[im 1/88]
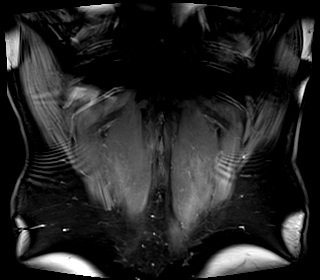
[im 30/88]
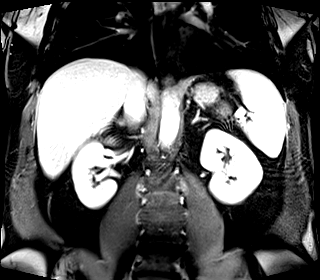
[im 59/88]
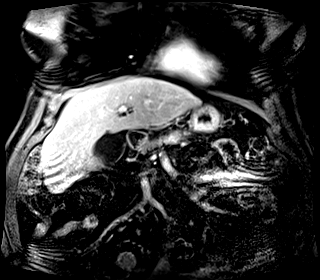
[im 88/88]
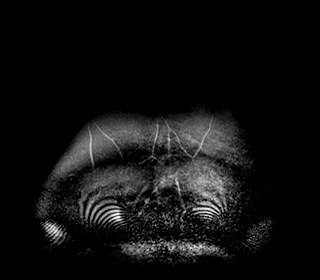

[Series 18: T1 dynamic post-contrast · axial · 3.0mm · 1.25mm/px · z∈[-119,+94]mm · 3 of 72 slices shown (8 of 9)]
[im 1/72]
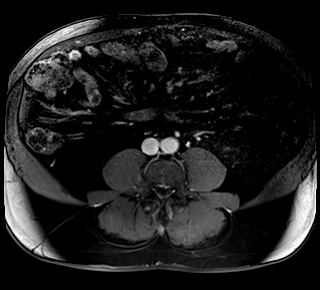
[im 36/72]
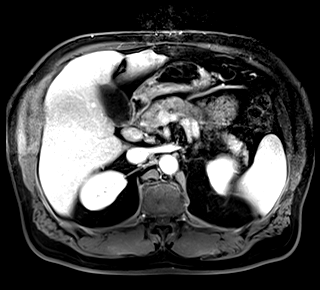
[im 72/72]
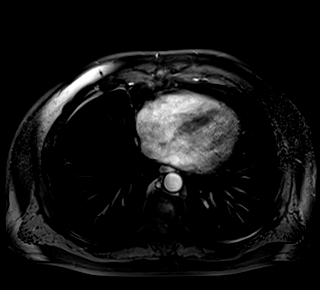

[Series 19: T1 dynamic post-contrast · axial · 3.0mm · 1.25mm/px · z∈[-119,+94]mm · 3 of 72 slices shown (9 of 9)]
[im 1/72]
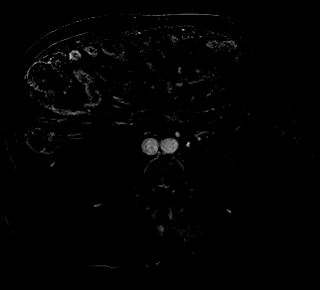
[im 36/72]
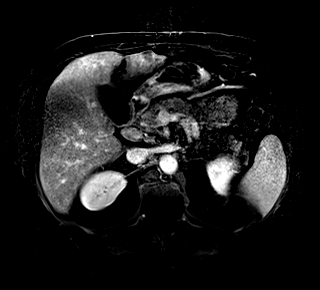
[im 72/72]
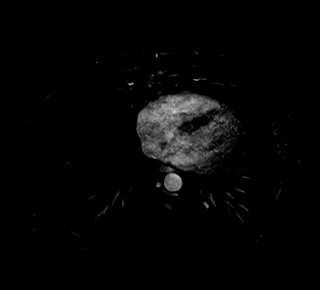

[48 of 48 positions shown; findings below may reference images not displayed]

FINDINGS: Lower chest: No acute abnormality at the lung bases.

Hepatobiliary: There is scattered lace-like T2 hyperintensity
throughout the liver suggestive of hepatic fibrosis and the liver
surface is mildly lobulated, suggestive of cirrhosis. No significant
hepatic steatosis. Low T2 signal intensity 1.8 x 1.5 cm anterior
segment 3 left liver lesion (series 18/image 36) without arterial
hyperenhancement and with portal venous washout, previously 1.5 x
1.5 cm, slightly increased. A few scattered subcentimeter T2
hyperintense lesions demonstrate discontinuous progressive
peripheral nodular enhancement compatible with hemangiomas,
unchanged. No new liver masses. Normal gallbladder with no
cholelithiasis. No biliary ductal dilatation. Common bile duct
diameter 3 mm. No choledocholithiasis. No biliary masses, strictures
or beading.

Pancreas: No pancreatic mass or duct dilation.  No pancreas divisum.

Spleen: Normal size. No mass.

Adrenals/Urinary Tract: Normal adrenals. No hydronephrosis. Normal
kidneys with no renal mass.

Stomach/Bowel: Normal non-distended stomach. Visualized small and
large bowel is normal caliber, with no bowel wall thickening.

Vascular/Lymphatic: Normal caliber abdominal aorta. Patent portal,
splenic, hepatic and renal veins. Mild porta hepatis adenopathy up
to 1.5 cm (series 15/image 39), stable. No new pathologically
enlarged abdominal lymph nodes.

Other: No abdominal ascites or focal fluid collection.

Musculoskeletal: No aggressive appearing focal osseous lesions.
IMPRESSION: 1. Subtle morphologic and parenchymal findings in the liver
suggestive of cirrhosis. Slight interval growth of 1.8 cm anterior
segment 3 left liver lobe mass with washout. Lesion remains PAULUS N
category 3 (intermediate probability of malignancy). Follow-up MRI
abdomen without and with IV contrast recommended in 6 months.
2. No new liver masses.
3. Stable mild porta hepatis adenopathy, nonspecific, probably
reactive.
4. No ascites.  Normal size spleen.

## 2020-07-30 MED ORDER — GADOBENATE DIMEGLUMINE 529 MG/ML IV SOLN
20.0000 mL | Freq: Once | INTRAVENOUS | Status: AC | PRN
  Administered 2020-07-30: 20 mL via INTRAVENOUS

## 2020-08-23 DIAGNOSIS — Z148 Genetic carrier of other disease: Secondary | ICD-10-CM | POA: Diagnosis not present

## 2020-08-23 DIAGNOSIS — K7460 Unspecified cirrhosis of liver: Secondary | ICD-10-CM | POA: Diagnosis not present

## 2020-08-23 DIAGNOSIS — K5900 Constipation, unspecified: Secondary | ICD-10-CM | POA: Diagnosis not present

## 2020-08-23 DIAGNOSIS — K219 Gastro-esophageal reflux disease without esophagitis: Secondary | ICD-10-CM | POA: Diagnosis not present

## 2020-08-23 DIAGNOSIS — K769 Liver disease, unspecified: Secondary | ICD-10-CM | POA: Diagnosis not present

## 2020-08-23 DIAGNOSIS — K317 Polyp of stomach and duodenum: Secondary | ICD-10-CM | POA: Diagnosis not present

## 2020-08-23 DIAGNOSIS — Z86010 Personal history of colonic polyps: Secondary | ICD-10-CM | POA: Diagnosis not present

## 2020-09-09 DIAGNOSIS — K7460 Unspecified cirrhosis of liver: Secondary | ICD-10-CM | POA: Diagnosis not present

## 2020-09-09 DIAGNOSIS — Z8546 Personal history of malignant neoplasm of prostate: Secondary | ICD-10-CM | POA: Diagnosis not present

## 2020-09-09 DIAGNOSIS — E119 Type 2 diabetes mellitus without complications: Secondary | ICD-10-CM | POA: Diagnosis not present

## 2020-09-09 DIAGNOSIS — F5101 Primary insomnia: Secondary | ICD-10-CM | POA: Diagnosis not present

## 2020-11-06 DIAGNOSIS — K769 Liver disease, unspecified: Secondary | ICD-10-CM | POA: Diagnosis not present

## 2020-11-06 DIAGNOSIS — K219 Gastro-esophageal reflux disease without esophagitis: Secondary | ICD-10-CM | POA: Diagnosis not present

## 2020-11-06 DIAGNOSIS — Z86010 Personal history of colonic polyps: Secondary | ICD-10-CM | POA: Diagnosis not present

## 2020-11-06 DIAGNOSIS — K317 Polyp of stomach and duodenum: Secondary | ICD-10-CM | POA: Diagnosis not present

## 2020-11-06 DIAGNOSIS — Z148 Genetic carrier of other disease: Secondary | ICD-10-CM | POA: Diagnosis not present

## 2020-11-06 DIAGNOSIS — K7460 Unspecified cirrhosis of liver: Secondary | ICD-10-CM | POA: Diagnosis not present

## 2020-11-06 DIAGNOSIS — K5900 Constipation, unspecified: Secondary | ICD-10-CM | POA: Diagnosis not present

## 2020-11-07 DIAGNOSIS — G4733 Obstructive sleep apnea (adult) (pediatric): Secondary | ICD-10-CM | POA: Diagnosis not present

## 2021-01-28 ENCOUNTER — Other Ambulatory Visit: Payer: Self-pay | Admitting: Gastroenterology

## 2021-01-28 DIAGNOSIS — K769 Liver disease, unspecified: Secondary | ICD-10-CM

## 2021-02-22 ENCOUNTER — Ambulatory Visit
Admission: RE | Admit: 2021-02-22 | Discharge: 2021-02-22 | Disposition: A | Discharge status: Home / Self Care | Payer: PPO | Source: Ambulatory Visit | Attending: Gastroenterology | Admitting: Gastroenterology

## 2021-02-22 ENCOUNTER — Other Ambulatory Visit: Payer: Self-pay | Admitting: Gastroenterology

## 2021-02-22 DIAGNOSIS — K769 Liver disease, unspecified: Secondary | ICD-10-CM

## 2021-02-22 DIAGNOSIS — K7460 Unspecified cirrhosis of liver: Secondary | ICD-10-CM | POA: Diagnosis not present

## 2021-02-22 DIAGNOSIS — K7689 Other specified diseases of liver: Secondary | ICD-10-CM | POA: Diagnosis not present

## 2021-02-22 DIAGNOSIS — R160 Hepatomegaly, not elsewhere classified: Secondary | ICD-10-CM | POA: Diagnosis not present

## 2021-02-22 IMAGING — MR MR ABDOMEN WO/W CM
10 of 17 series · 26 of 48 positions shown · IV contrast (20ml Multihance)
Comparison: MRI [DATE]

CLINICAL DATA: Hepatic lesion.

EXAM:
MRI ABDOMEN WITHOUT AND WITH CONTRAST
TECHNIQUE: Multiplanar multisequence MR imaging of the abdomen was performed
both before and after the administration of intravenous contrast.
CONTRAST:  20mL MULTIHANCE GADOBENATE DIMEGLUMINE 529 MG/ML IV SOLN

[Series 3: cor haste · coronal · 5.0mm · 0.78mm/px · 3 of 44 slices shown]
[im 1/44]
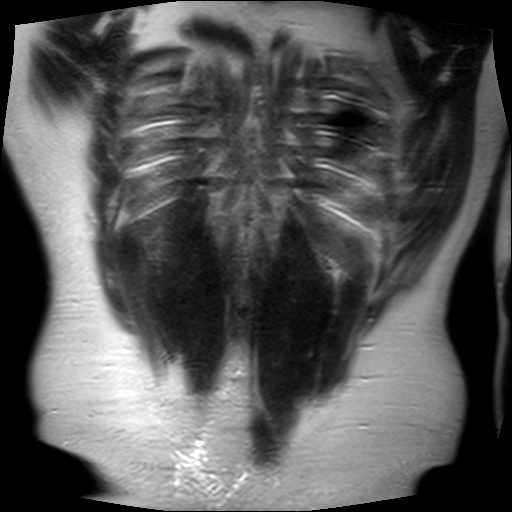
[im 22/44]
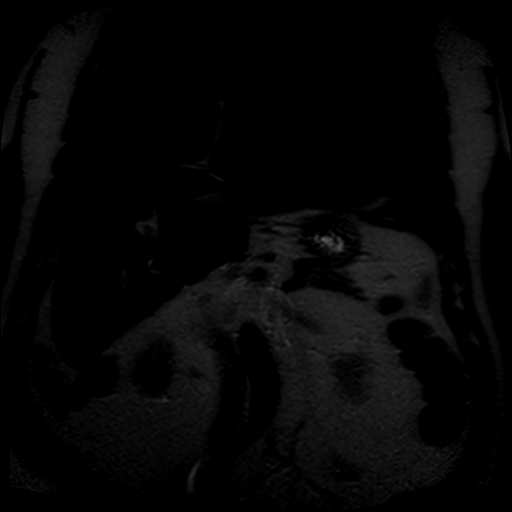
[im 44/44]
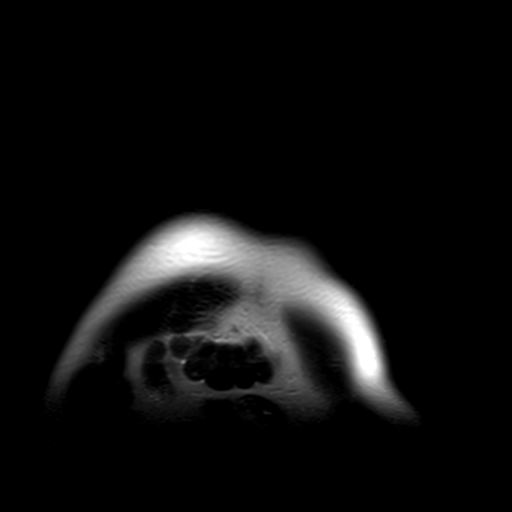

[Series 4: axial haste · axial · 6.0mm · 0.78mm/px · z∈[-178,+60]mm · 2 of 37 slices shown]
[im 1/37]
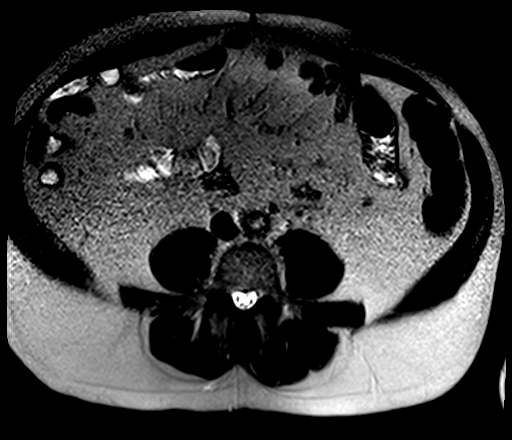
[im 37/37]
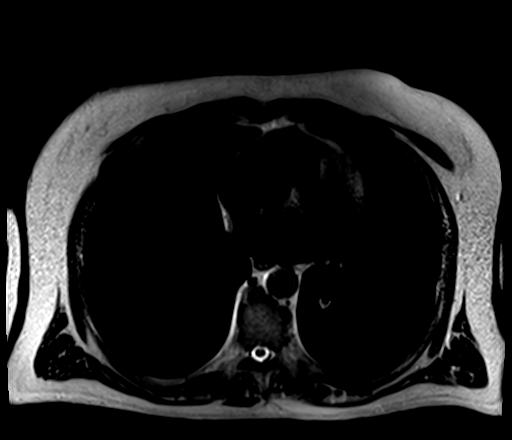

[Series 5: T1 · axial · 6.0mm · 0.78mm/px · z∈[-181,+63]mm · 4 of 76 slices shown]
[im 1/76]
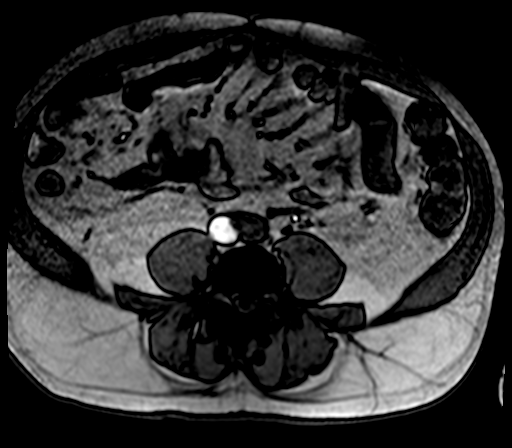
[im 26/76]
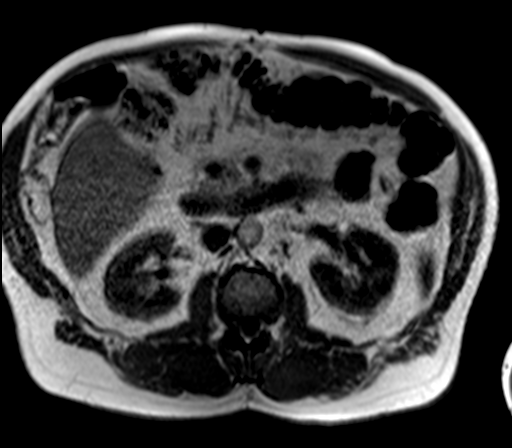
[im 51/76]
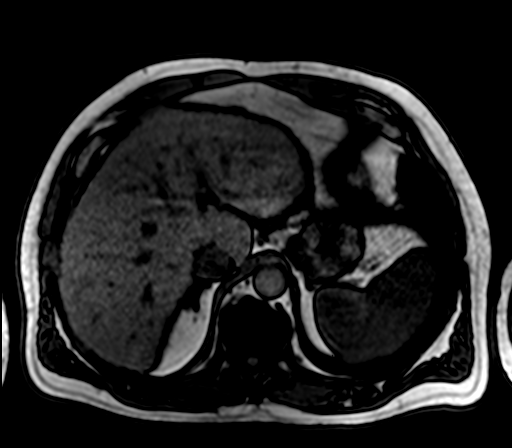
[im 76/76]
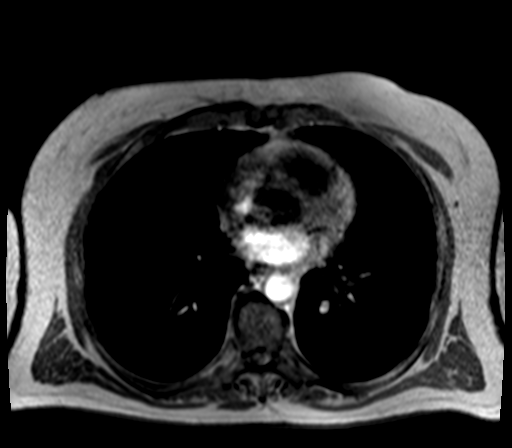

[Series 7: bSSFP · axial · 4.0mm · 0.78mm/px · z∈[-161,+63]mm · 2 of 57 slices shown]
[im 1/57]
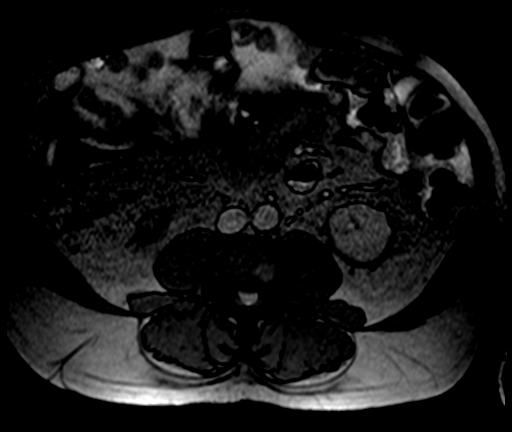
[im 57/57]
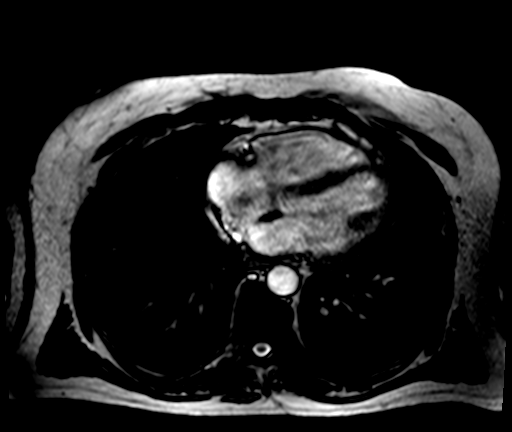

[Series 8: T2 fat-sat · axial · 6.0mm · 1.25mm/px · 1 of 34 slices shown]
[im 1/34]
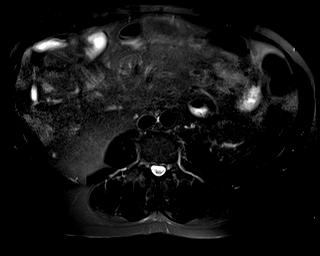

[Series 9: ep2d_diff_b50_500_800_p2_trig · axial · 6.0mm · 2.08mm/px · z∈[-147,+91]mm · 4 of 102 slices shown]
[im 1/102]
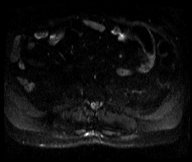
[im 34/102]
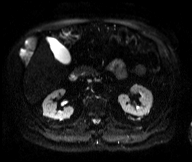
[im 68/102]
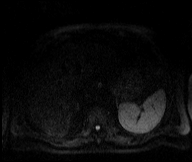
[im 102/102]
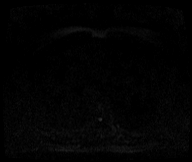

[Series 10: ep2d_diff_b50_500_800_p2_trig_adc · axial · 6.0mm · 2.08mm/px · 1 of 34 slices shown]
[im 1/34]
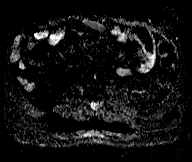

[Series 11: T1 dynamic · axial · non-contrast · 2.5mm · 0.78mm/px · z∈[-167,+50]mm · 3 of 88 slices shown]
[im 1/88]
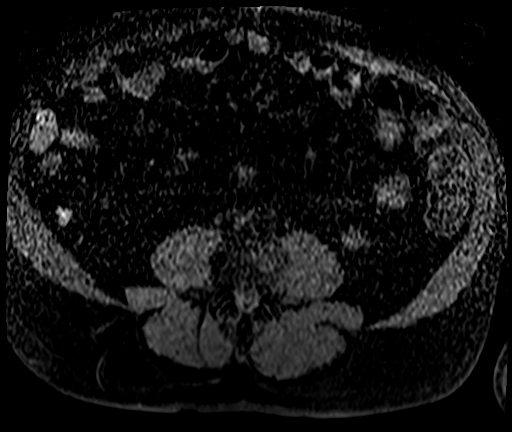
[im 44/88]
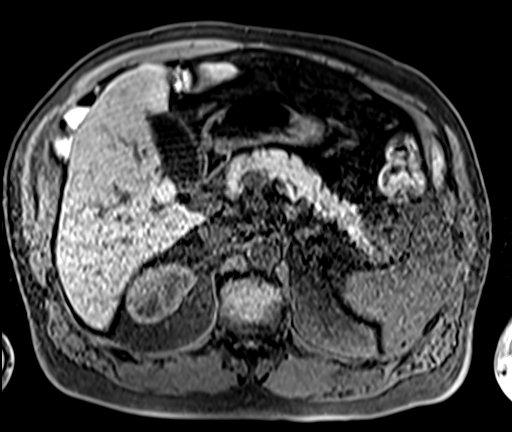
[im 88/88]
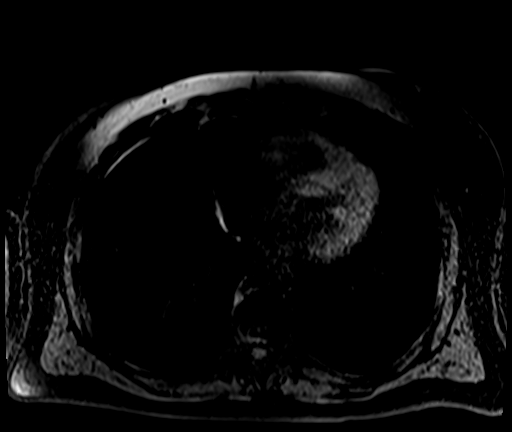

[Series 12: T1 dynamic post-contrast · axial · 2.5mm · 0.78mm/px · z∈[-167,+50]mm · 3 of 88 slices shown (1 of 2)]
[im 1/88]
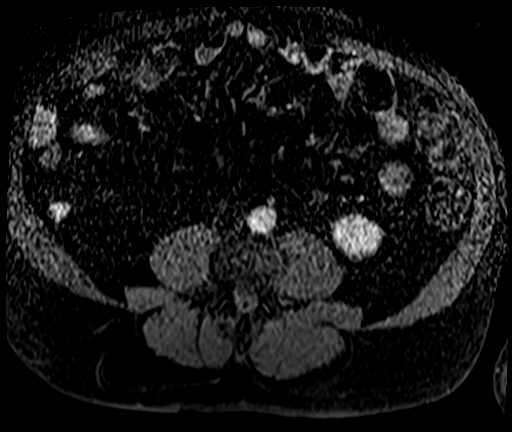
[im 44/88]
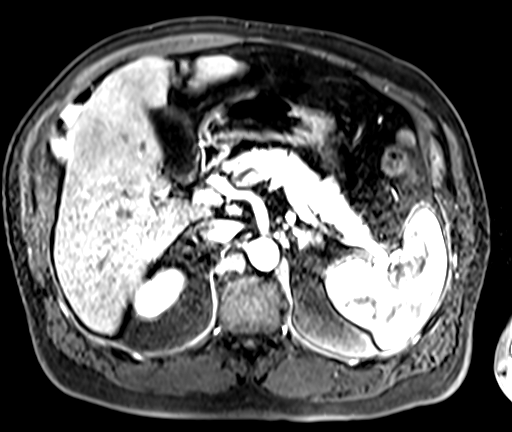
[im 88/88]
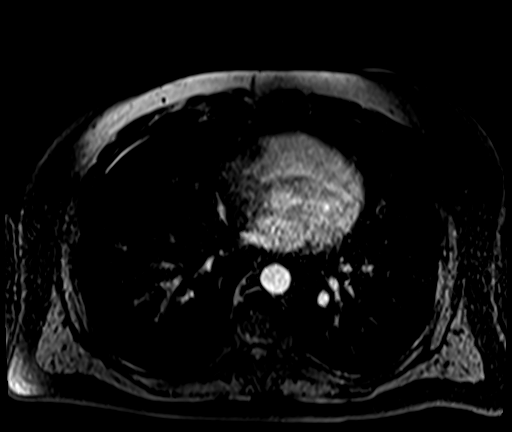

[Series 13: T1 dynamic post-contrast · axial · 2.5mm · 0.78mm/px · z∈[-167,+50]mm · 3 of 88 slices shown (2 of 2)]
[im 1/88]
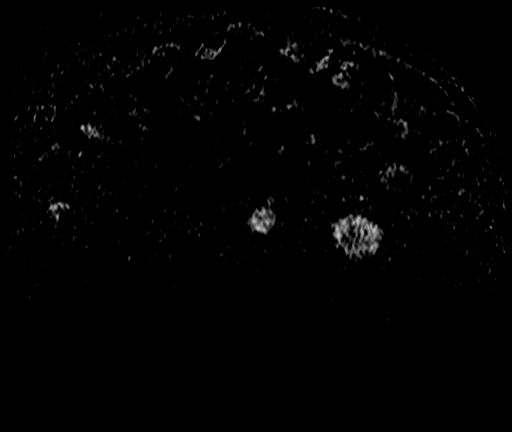
[im 44/88]
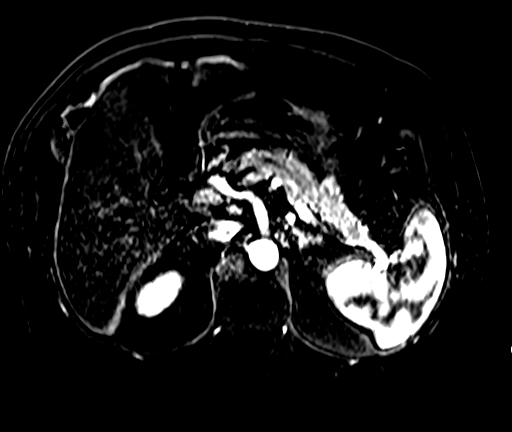
[im 88/88]
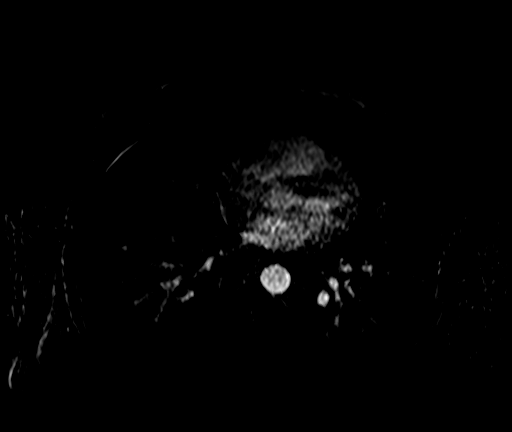

[26 of 48 positions shown; findings below may reference images not displayed]

FINDINGS: Lower chest: Lung bases are clear.

Hepatobiliary: Liver has a lobular contour with enlargement of the
caudate lobe. No hepatic steatosis identified on opposed phase
imaging (series 5)

In the anterior margin of the lateral LEFT hepatic lobe (segment 3)
is again demonstrated at round hypoenhancing subcapsular lesion
measuring 13 mm x 13 mm ( image 41/series 19) compared with 15 mm x
12 mm on comparison exam remeasured. No interval growth. Lesion is
hypointense on T2 weighted imaging (image [DATE]).

Several small benign cysts within the LEFT hepatic lobe also
unchanged.

No new hepatic lesions.  No biliary duct dilatation.

Pancreas: Pancreas is normal. No ductal dilatation. No pancreatic
inflammation.

Spleen: Normal spleen

Adrenals/urinary tract: Adrenal glands and kidneys are normal.

Stomach/Bowel: Stomach, small bowel, appendix, and cecum are normal.
The colon and rectosigmoid colon are normal.

Vascular/Lymphatic: Abdominal aorta is normal caliber. No periportal
or retroperitoneal adenopathy. No pelvic adenopathy.

Musculoskeletal: No aggressive osseous lesion.
IMPRESSION: 1. Morphologic changes in liver suggest early cirrhosis.
2. Indeterminate hypoenhancing lesion in the LEFT hepatic lobe is
unchanged in size from comparison exam [DATE]. TY: 3.
Lesion remains TY
category 3 (intermediate probability of malignancy). Follow-up MRI
abdomen without and with IV contrast recommended in 6 months.

## 2021-02-22 MED ORDER — GADOBENATE DIMEGLUMINE 529 MG/ML IV SOLN
20.0000 mL | Freq: Once | INTRAVENOUS | Status: AC | PRN
  Administered 2021-02-22: 20 mL via INTRAVENOUS

## 2021-03-10 DIAGNOSIS — Z148 Genetic carrier of other disease: Secondary | ICD-10-CM | POA: Diagnosis not present

## 2021-03-10 DIAGNOSIS — K219 Gastro-esophageal reflux disease without esophagitis: Secondary | ICD-10-CM | POA: Diagnosis not present

## 2021-03-10 DIAGNOSIS — K769 Liver disease, unspecified: Secondary | ICD-10-CM | POA: Diagnosis not present

## 2021-03-10 DIAGNOSIS — K5900 Constipation, unspecified: Secondary | ICD-10-CM | POA: Diagnosis not present

## 2021-03-10 DIAGNOSIS — K7460 Unspecified cirrhosis of liver: Secondary | ICD-10-CM | POA: Diagnosis not present

## 2021-03-10 DIAGNOSIS — K317 Polyp of stomach and duodenum: Secondary | ICD-10-CM | POA: Diagnosis not present

## 2021-03-10 DIAGNOSIS — Z86010 Personal history of colonic polyps: Secondary | ICD-10-CM | POA: Diagnosis not present

## 2021-04-22 DIAGNOSIS — F5101 Primary insomnia: Secondary | ICD-10-CM | POA: Diagnosis not present

## 2021-04-22 DIAGNOSIS — K7460 Unspecified cirrhosis of liver: Secondary | ICD-10-CM | POA: Diagnosis not present

## 2021-04-22 DIAGNOSIS — E119 Type 2 diabetes mellitus without complications: Secondary | ICD-10-CM | POA: Diagnosis not present

## 2021-04-22 DIAGNOSIS — Z8546 Personal history of malignant neoplasm of prostate: Secondary | ICD-10-CM | POA: Diagnosis not present

## 2021-08-21 DIAGNOSIS — K2282 Esophagogastric junction polyp: Secondary | ICD-10-CM | POA: Diagnosis not present

## 2021-08-21 DIAGNOSIS — K2950 Unspecified chronic gastritis without bleeding: Secondary | ICD-10-CM | POA: Diagnosis not present

## 2021-08-21 DIAGNOSIS — I8510 Secondary esophageal varices without bleeding: Secondary | ICD-10-CM | POA: Diagnosis not present

## 2021-08-21 DIAGNOSIS — K648 Other hemorrhoids: Secondary | ICD-10-CM | POA: Diagnosis not present

## 2021-08-21 DIAGNOSIS — D123 Benign neoplasm of transverse colon: Secondary | ICD-10-CM | POA: Diagnosis not present

## 2021-08-21 DIAGNOSIS — K7460 Unspecified cirrhosis of liver: Secondary | ICD-10-CM | POA: Diagnosis not present

## 2021-08-21 DIAGNOSIS — Z86010 Personal history of colonic polyps: Secondary | ICD-10-CM | POA: Diagnosis not present

## 2021-08-21 DIAGNOSIS — K317 Polyp of stomach and duodenum: Secondary | ICD-10-CM | POA: Diagnosis not present

## 2021-08-26 DIAGNOSIS — D123 Benign neoplasm of transverse colon: Secondary | ICD-10-CM | POA: Diagnosis not present

## 2021-08-26 DIAGNOSIS — K317 Polyp of stomach and duodenum: Secondary | ICD-10-CM | POA: Diagnosis not present

## 2021-10-13 ENCOUNTER — Other Ambulatory Visit: Payer: Self-pay | Admitting: Gastroenterology

## 2021-10-13 DIAGNOSIS — K769 Liver disease, unspecified: Secondary | ICD-10-CM

## 2021-11-06 DIAGNOSIS — G4733 Obstructive sleep apnea (adult) (pediatric): Secondary | ICD-10-CM | POA: Diagnosis not present

## 2021-11-09 ENCOUNTER — Other Ambulatory Visit: Payer: Self-pay

## 2021-11-09 ENCOUNTER — Other Ambulatory Visit: Payer: PPO

## 2021-11-09 ENCOUNTER — Ambulatory Visit
Admission: RE | Admit: 2021-11-09 | Discharge: 2021-11-09 | Disposition: A | Discharge status: Home / Self Care | Payer: PPO | Source: Ambulatory Visit | Attending: Gastroenterology | Admitting: Gastroenterology

## 2021-11-09 DIAGNOSIS — K7689 Other specified diseases of liver: Secondary | ICD-10-CM | POA: Diagnosis not present

## 2021-11-09 DIAGNOSIS — K7460 Unspecified cirrhosis of liver: Secondary | ICD-10-CM | POA: Diagnosis not present

## 2021-11-09 DIAGNOSIS — K769 Liver disease, unspecified: Secondary | ICD-10-CM

## 2021-11-09 IMAGING — MR MR ABDOMEN WO/W CM
12 of 17 series · 31 of 48 positions shown · IV contrast (20 ML MULTIHANCE)
Comparison: [DATE] and and earlier studies dating back to
[DATE]

CLINICAL DATA: Cirrhosis.  Follow-up indeterminate liver lesion.

EXAM:
MRI ABDOMEN WITHOUT AND WITH CONTRAST
TECHNIQUE: Multiplanar multisequence MR imaging of the abdomen was performed
both before and after the administration of intravenous contrast.
CONTRAST:  20mL MULTIHANCE GADOBENATE DIMEGLUMINE 529 MG/ML IV SOLN

[Series 3: T2 · coronal · 5.0mm · 1.56mm/px · 2 of 36 slices shown (1 of 3)]
[im 1/36]
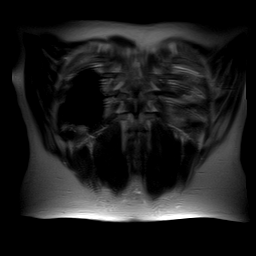
[im 36/36]
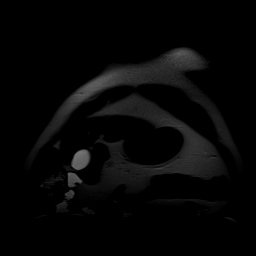

[Series 4: axial tru fisp · axial · 5.0mm · 1.56mm/px · z∈[-185,+39]mm · 3 of 40 slices shown]
[im 1/40]
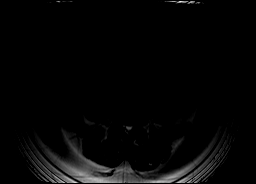
[im 20/40]
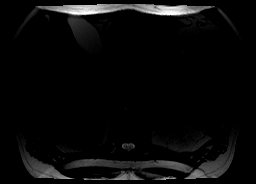
[im 40/40]
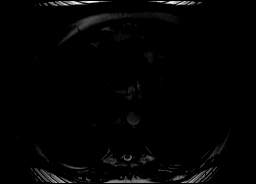

[Series 5: T2 · axial · 6.5mm · 0.74mm/px · z∈[-138,+89]mm · 2 of 30 slices shown (2 of 3)]
[im 1/30]
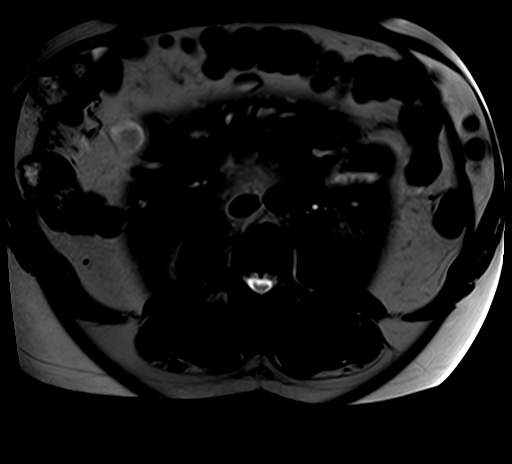
[im 30/30]
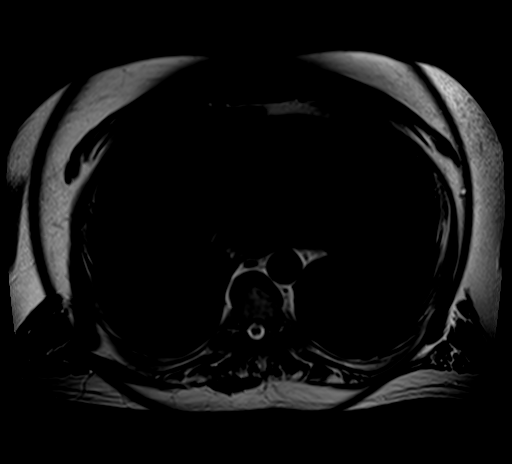

[Series 6: ep2d_diff_b50_500_800_p2 · axial · 6.0mm · 1.98mm/px · z∈[-152,+106]mm · 5 of 102 slices shown]
[im 1/102]
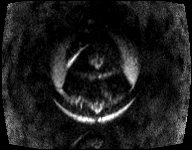
[im 26/102]
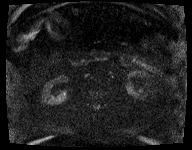
[im 51/102]
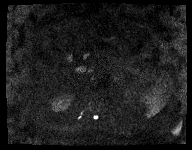
[im 76/102]
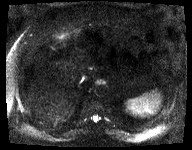
[im 102/102]
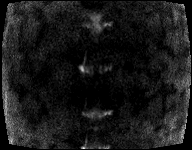

[Series 7: ep2d_diff_b50_500_800_p2_adc · axial · 6.0mm · 1.98mm/px · z∈[-152,+106]mm · 2 of 34 slices shown]
[im 1/34]
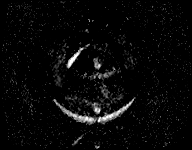
[im 34/34]
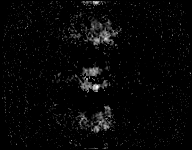

[Series 8: T2 · axial · 5.0mm · 1.45mm/px · 1 of 35 slices shown (3 of 3)]
[im 1/35]
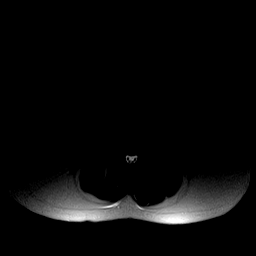

[Series 9: axial in out · axial · 6.0mm · 0.74mm/px · z∈[-177,+51]mm · 3 of 68 slices shown]
[im 1/68]
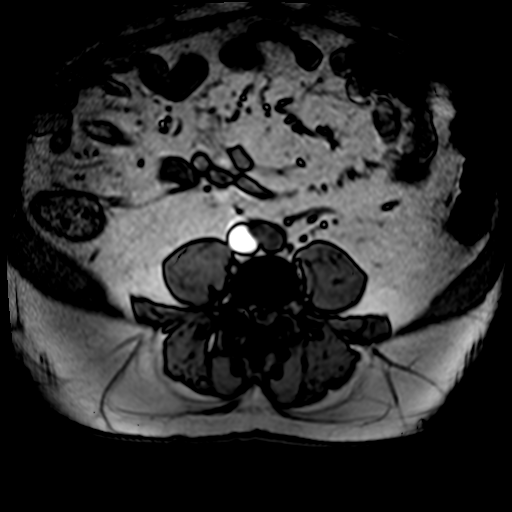
[im 34/68]
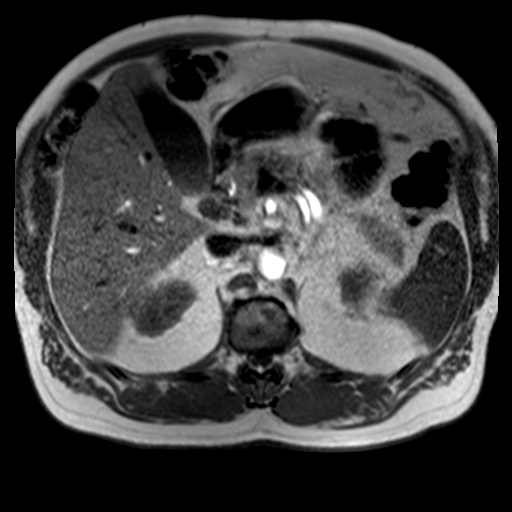
[im 68/68]
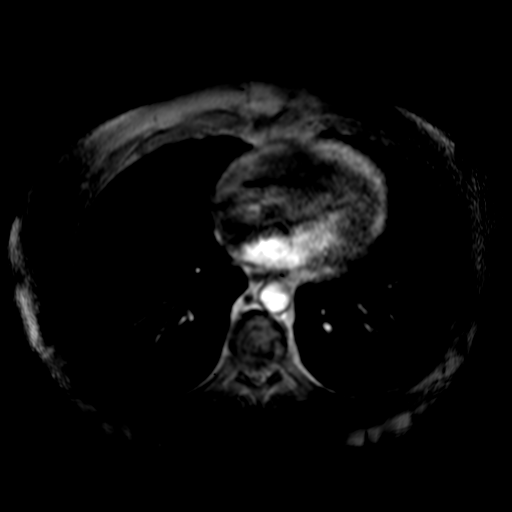

[Series 10: T1 dynamic · axial · non-contrast · 2.2mm · 0.78mm/px · z∈[-152,+39]mm · 3 of 88 slices shown]
[im 1/88]
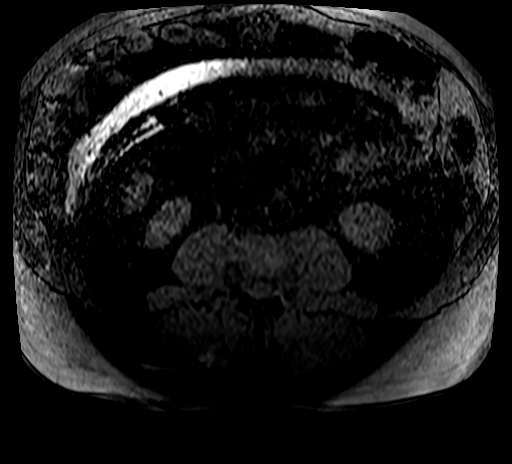
[im 44/88]
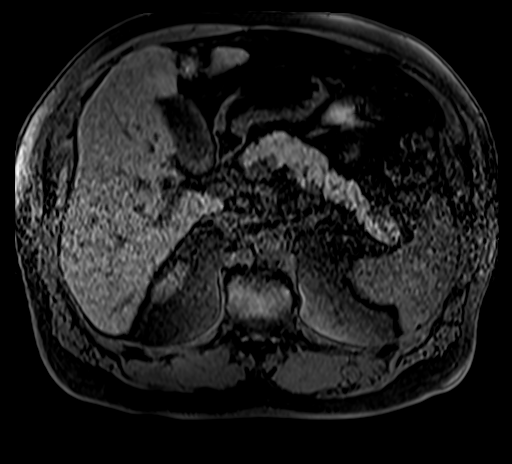
[im 88/88]
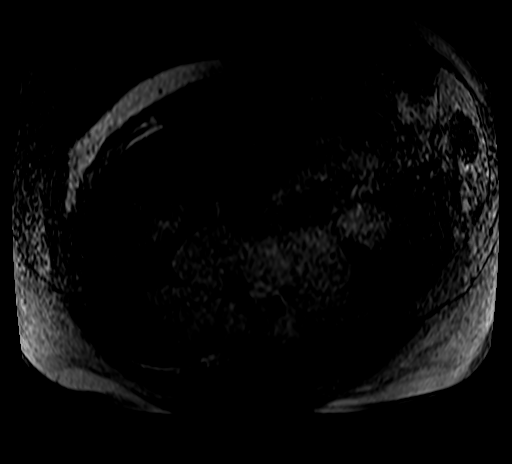

[Series 11: post 25 sec · axial · 2.2mm · 0.78mm/px · z∈[-152,+39]mm · 3 of 88 slices shown]
[im 1/88]
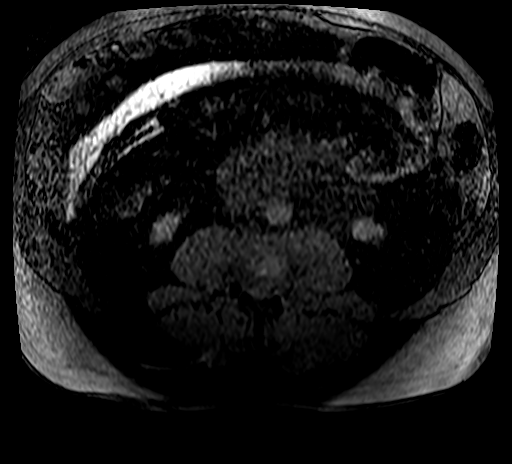
[im 44/88]
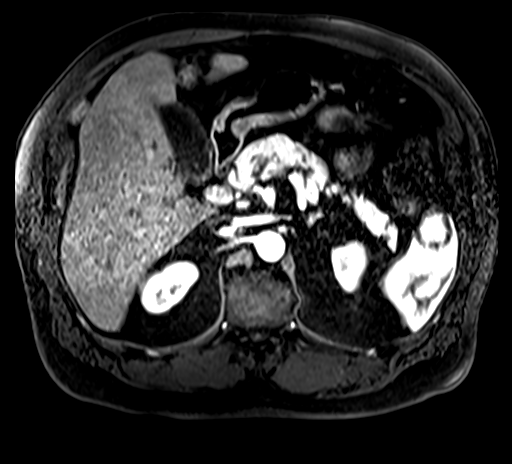
[im 88/88]
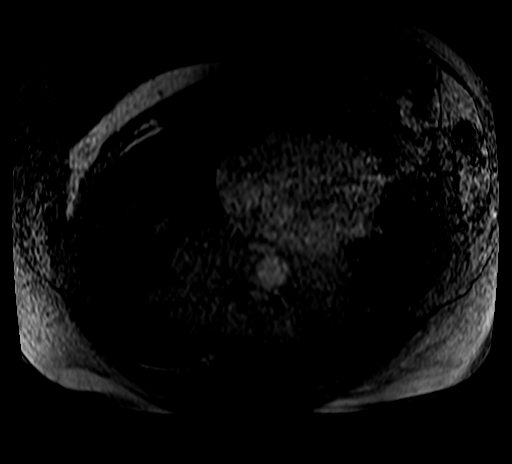

[Series 12: post 25 sec_sub · axial · 2.2mm · 0.78mm/px · z∈[-152,+39]mm · 3 of 88 slices shown]
[im 1/88]
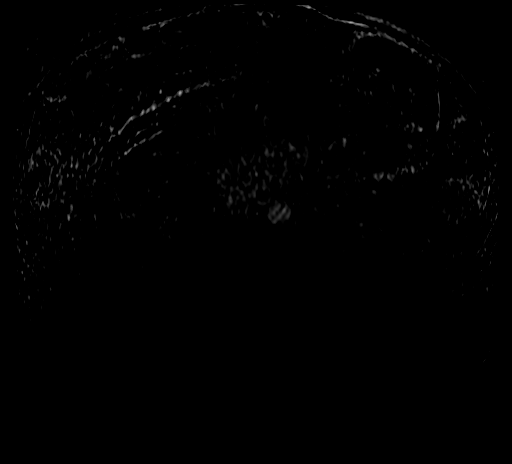
[im 44/88]
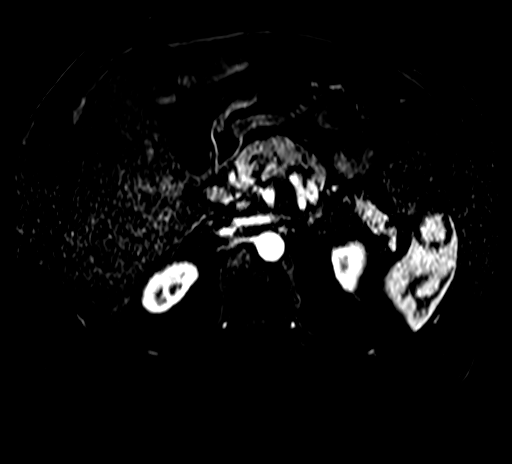
[im 88/88]
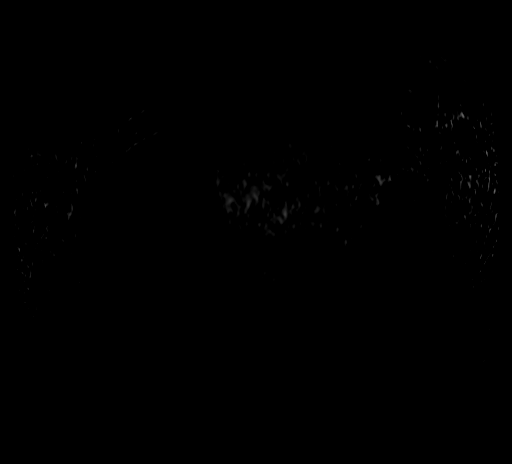

[Series 13: post 45 sec · axial · 2.2mm · 0.78mm/px · z∈[-152,+39]mm · 3 of 88 slices shown]
[im 1/88]
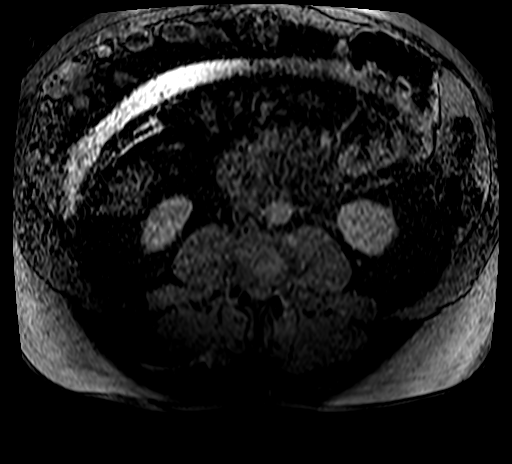
[im 44/88]
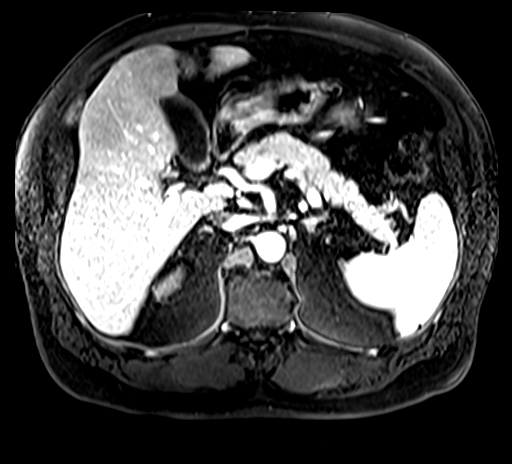
[im 88/88]
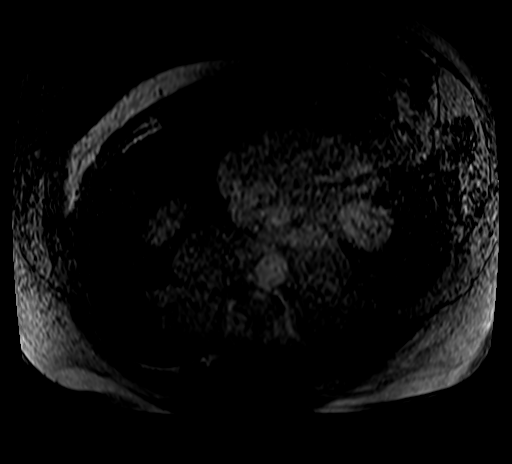

[Series 14: post 45 sec_sub · axial · 2.2mm · 0.78mm/px · 1 of 88 slices shown]
[im 1/88]
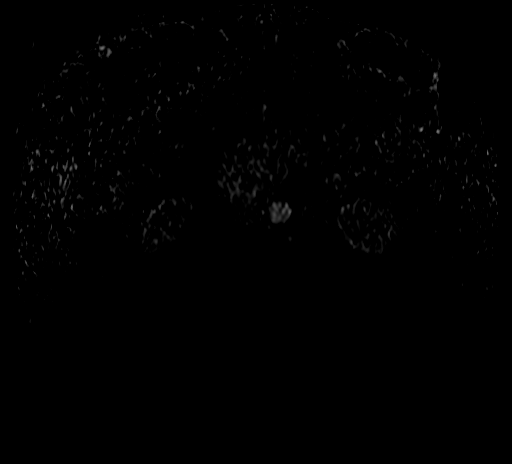

[31 of 48 positions shown; findings below may reference images not displayed]

FINDINGS: Lower chest: No acute findings.

Hepatobiliary: Hepatic cirrhosis again noted. A few tiny sub-cm
cysts are again seen. A solid subcapsular nodule is again seen in
segment 3 of the left hepatic lobe which measures 1.4 x 1 4 cm on
image 39/18. This shows T2 isointensity and nearly isointense
contrast enhancement. This is stable compared to prior studies. No
new or enlarging liver lesions are identified. Gallbladder is
unremarkable. No evidence of biliary ductal dilatation.

Pancreas:  No mass or inflammatory changes.

Spleen:  Within normal limits in size and appearance.

Adrenals/Urinary Tract: No masses identified. No evidence of
hydronephrosis.

Stomach/Bowel: Unremarkable.

Vascular/Lymphatic: No pathologically enlarged lymph nodes
identified. No acute vascular findings.

Other:  None.

Musculoskeletal:  No suspicious bone lesions identified.
IMPRESSION: Hepatic cirrhosis.

Stable 1.4 cm subcapsular nodule in segment 3 of the left hepatic
lobe, which remains indeterminate. (LABELLE SALHA Category 3: Intermediate
probability of malignancy) Recommend continued follow-up with MRI in
6 months.

## 2021-11-09 MED ORDER — GADOBENATE DIMEGLUMINE 529 MG/ML IV SOLN
20.0000 mL | Freq: Once | INTRAVENOUS | Status: AC | PRN
  Administered 2021-11-09: 20 mL via INTRAVENOUS

## 2021-11-13 DIAGNOSIS — G4733 Obstructive sleep apnea (adult) (pediatric): Secondary | ICD-10-CM | POA: Diagnosis not present

## 2021-11-25 DIAGNOSIS — L918 Other hypertrophic disorders of the skin: Secondary | ICD-10-CM | POA: Diagnosis not present

## 2021-11-25 DIAGNOSIS — F5101 Primary insomnia: Secondary | ICD-10-CM | POA: Diagnosis not present

## 2021-11-25 DIAGNOSIS — Z8546 Personal history of malignant neoplasm of prostate: Secondary | ICD-10-CM | POA: Diagnosis not present

## 2021-11-25 DIAGNOSIS — B351 Tinea unguium: Secondary | ICD-10-CM | POA: Diagnosis not present

## 2021-11-25 DIAGNOSIS — K7460 Unspecified cirrhosis of liver: Secondary | ICD-10-CM | POA: Diagnosis not present

## 2021-11-25 DIAGNOSIS — E119 Type 2 diabetes mellitus without complications: Secondary | ICD-10-CM | POA: Diagnosis not present

## 2021-12-14 DIAGNOSIS — G4733 Obstructive sleep apnea (adult) (pediatric): Secondary | ICD-10-CM | POA: Diagnosis not present

## 2022-01-11 DIAGNOSIS — G4733 Obstructive sleep apnea (adult) (pediatric): Secondary | ICD-10-CM | POA: Diagnosis not present

## 2022-01-20 DIAGNOSIS — L918 Other hypertrophic disorders of the skin: Secondary | ICD-10-CM | POA: Diagnosis not present

## 2022-02-05 DIAGNOSIS — G4733 Obstructive sleep apnea (adult) (pediatric): Secondary | ICD-10-CM | POA: Diagnosis not present

## 2022-02-11 DIAGNOSIS — G4733 Obstructive sleep apnea (adult) (pediatric): Secondary | ICD-10-CM | POA: Diagnosis not present

## 2022-03-09 DIAGNOSIS — G4733 Obstructive sleep apnea (adult) (pediatric): Secondary | ICD-10-CM | POA: Diagnosis not present

## 2022-03-13 DIAGNOSIS — G4733 Obstructive sleep apnea (adult) (pediatric): Secondary | ICD-10-CM | POA: Diagnosis not present

## 2022-04-13 DIAGNOSIS — G4733 Obstructive sleep apnea (adult) (pediatric): Secondary | ICD-10-CM | POA: Diagnosis not present

## 2022-05-13 DIAGNOSIS — G4733 Obstructive sleep apnea (adult) (pediatric): Secondary | ICD-10-CM | POA: Diagnosis not present

## 2022-05-19 DIAGNOSIS — E119 Type 2 diabetes mellitus without complications: Secondary | ICD-10-CM | POA: Diagnosis not present

## 2022-05-19 DIAGNOSIS — F5101 Primary insomnia: Secondary | ICD-10-CM | POA: Diagnosis not present

## 2022-05-19 DIAGNOSIS — Z8546 Personal history of malignant neoplasm of prostate: Secondary | ICD-10-CM | POA: Diagnosis not present

## 2022-05-19 DIAGNOSIS — K219 Gastro-esophageal reflux disease without esophagitis: Secondary | ICD-10-CM | POA: Diagnosis not present

## 2022-05-19 DIAGNOSIS — K7460 Unspecified cirrhosis of liver: Secondary | ICD-10-CM | POA: Diagnosis not present

## 2022-06-13 DIAGNOSIS — G4733 Obstructive sleep apnea (adult) (pediatric): Secondary | ICD-10-CM | POA: Diagnosis not present

## 2022-06-22 DIAGNOSIS — G4733 Obstructive sleep apnea (adult) (pediatric): Secondary | ICD-10-CM | POA: Diagnosis not present

## 2022-07-08 DIAGNOSIS — E119 Type 2 diabetes mellitus without complications: Secondary | ICD-10-CM | POA: Diagnosis not present

## 2022-07-08 DIAGNOSIS — H52223 Regular astigmatism, bilateral: Secondary | ICD-10-CM | POA: Diagnosis not present

## 2022-07-08 DIAGNOSIS — H2513 Age-related nuclear cataract, bilateral: Secondary | ICD-10-CM | POA: Diagnosis not present

## 2022-07-08 DIAGNOSIS — H43391 Other vitreous opacities, right eye: Secondary | ICD-10-CM | POA: Diagnosis not present

## 2022-07-08 DIAGNOSIS — H524 Presbyopia: Secondary | ICD-10-CM | POA: Diagnosis not present

## 2022-07-08 DIAGNOSIS — H5211 Myopia, right eye: Secondary | ICD-10-CM | POA: Diagnosis not present

## 2022-07-14 DIAGNOSIS — G4733 Obstructive sleep apnea (adult) (pediatric): Secondary | ICD-10-CM | POA: Diagnosis not present

## 2022-08-13 DIAGNOSIS — G4733 Obstructive sleep apnea (adult) (pediatric): Secondary | ICD-10-CM | POA: Diagnosis not present

## 2022-09-01 DIAGNOSIS — X32XXXD Exposure to sunlight, subsequent encounter: Secondary | ICD-10-CM | POA: Diagnosis not present

## 2022-09-01 DIAGNOSIS — L820 Inflamed seborrheic keratosis: Secondary | ICD-10-CM | POA: Diagnosis not present

## 2022-09-01 DIAGNOSIS — I781 Nevus, non-neoplastic: Secondary | ICD-10-CM | POA: Diagnosis not present

## 2022-09-01 DIAGNOSIS — D239 Other benign neoplasm of skin, unspecified: Secondary | ICD-10-CM | POA: Diagnosis not present

## 2022-09-01 DIAGNOSIS — L570 Actinic keratosis: Secondary | ICD-10-CM | POA: Diagnosis not present

## 2022-09-01 DIAGNOSIS — Z1283 Encounter for screening for malignant neoplasm of skin: Secondary | ICD-10-CM | POA: Diagnosis not present

## 2022-09-01 DIAGNOSIS — D225 Melanocytic nevi of trunk: Secondary | ICD-10-CM | POA: Diagnosis not present

## 2022-09-13 DIAGNOSIS — G4733 Obstructive sleep apnea (adult) (pediatric): Secondary | ICD-10-CM | POA: Diagnosis not present

## 2022-10-13 DIAGNOSIS — G4733 Obstructive sleep apnea (adult) (pediatric): Secondary | ICD-10-CM | POA: Diagnosis not present

## 2022-10-23 DIAGNOSIS — F5101 Primary insomnia: Secondary | ICD-10-CM | POA: Diagnosis not present

## 2022-10-23 DIAGNOSIS — U071 COVID-19: Secondary | ICD-10-CM | POA: Diagnosis not present

## 2022-11-13 DIAGNOSIS — G4733 Obstructive sleep apnea (adult) (pediatric): Secondary | ICD-10-CM | POA: Diagnosis not present

## 2022-12-03 ENCOUNTER — Other Ambulatory Visit: Payer: Self-pay | Admitting: Family Medicine

## 2022-12-03 DIAGNOSIS — K769 Liver disease, unspecified: Secondary | ICD-10-CM | POA: Diagnosis not present

## 2022-12-03 DIAGNOSIS — K219 Gastro-esophageal reflux disease without esophagitis: Secondary | ICD-10-CM | POA: Diagnosis not present

## 2022-12-03 DIAGNOSIS — Z8546 Personal history of malignant neoplasm of prostate: Secondary | ICD-10-CM | POA: Diagnosis not present

## 2022-12-03 DIAGNOSIS — E119 Type 2 diabetes mellitus without complications: Secondary | ICD-10-CM | POA: Diagnosis not present

## 2022-12-03 DIAGNOSIS — F5101 Primary insomnia: Secondary | ICD-10-CM | POA: Diagnosis not present

## 2022-12-03 DIAGNOSIS — I8510 Secondary esophageal varices without bleeding: Secondary | ICD-10-CM | POA: Diagnosis not present

## 2022-12-03 DIAGNOSIS — K7460 Unspecified cirrhosis of liver: Secondary | ICD-10-CM | POA: Diagnosis not present

## 2022-12-18 DIAGNOSIS — G4733 Obstructive sleep apnea (adult) (pediatric): Secondary | ICD-10-CM | POA: Diagnosis not present

## 2022-12-26 ENCOUNTER — Ambulatory Visit
Admission: RE | Admit: 2022-12-26 | Discharge: 2022-12-26 | Disposition: A | Discharge status: Home / Self Care | Payer: PPO | Source: Ambulatory Visit | Attending: Family Medicine | Admitting: Family Medicine

## 2022-12-26 DIAGNOSIS — K769 Liver disease, unspecified: Secondary | ICD-10-CM

## 2022-12-26 DIAGNOSIS — K7689 Other specified diseases of liver: Secondary | ICD-10-CM | POA: Diagnosis not present

## 2022-12-26 MED ORDER — GADOPICLENOL 0.5 MMOL/ML IV SOLN: 10.0000 mL | Freq: Once | INTRAVENOUS | Status: DC | PRN

## 2022-12-26 MED ORDER — GADOPICLENOL 0.5 MMOL/ML IV SOLN
10.0000 mL | Freq: Once | INTRAVENOUS | Status: AC | PRN
  Administered 2022-12-26: 10 mL via INTRAVENOUS

## 2023-03-09 DIAGNOSIS — G4733 Obstructive sleep apnea (adult) (pediatric): Secondary | ICD-10-CM | POA: Diagnosis not present

## 2023-03-12 DIAGNOSIS — H1131 Conjunctival hemorrhage, right eye: Secondary | ICD-10-CM | POA: Diagnosis not present

## 2023-03-12 DIAGNOSIS — Z9989 Dependence on other enabling machines and devices: Secondary | ICD-10-CM | POA: Diagnosis not present

## 2023-03-12 DIAGNOSIS — R030 Elevated blood-pressure reading, without diagnosis of hypertension: Secondary | ICD-10-CM | POA: Diagnosis not present

## 2023-05-13 DIAGNOSIS — M25561 Pain in right knee: Secondary | ICD-10-CM | POA: Diagnosis not present

## 2023-06-02 DIAGNOSIS — K219 Gastro-esophageal reflux disease without esophagitis: Secondary | ICD-10-CM | POA: Diagnosis not present

## 2023-06-02 DIAGNOSIS — F5101 Primary insomnia: Secondary | ICD-10-CM | POA: Diagnosis not present

## 2023-06-02 DIAGNOSIS — Z8546 Personal history of malignant neoplasm of prostate: Secondary | ICD-10-CM | POA: Diagnosis not present

## 2023-06-02 DIAGNOSIS — K769 Liver disease, unspecified: Secondary | ICD-10-CM | POA: Diagnosis not present

## 2023-06-02 DIAGNOSIS — K7460 Unspecified cirrhosis of liver: Secondary | ICD-10-CM | POA: Diagnosis not present

## 2023-06-02 DIAGNOSIS — I10 Essential (primary) hypertension: Secondary | ICD-10-CM | POA: Diagnosis not present

## 2023-06-02 DIAGNOSIS — E119 Type 2 diabetes mellitus without complications: Secondary | ICD-10-CM | POA: Diagnosis not present

## 2023-06-02 DIAGNOSIS — I8510 Secondary esophageal varices without bleeding: Secondary | ICD-10-CM | POA: Diagnosis not present

## 2023-06-04 DIAGNOSIS — G4733 Obstructive sleep apnea (adult) (pediatric): Secondary | ICD-10-CM | POA: Diagnosis not present

## 2023-06-14 DIAGNOSIS — G4733 Obstructive sleep apnea (adult) (pediatric): Secondary | ICD-10-CM | POA: Diagnosis not present

## 2023-07-02 DIAGNOSIS — I10 Essential (primary) hypertension: Secondary | ICD-10-CM | POA: Diagnosis not present

## 2023-07-02 DIAGNOSIS — M222X1 Patellofemoral disorders, right knee: Secondary | ICD-10-CM | POA: Diagnosis not present

## 2023-07-08 ENCOUNTER — Encounter: Payer: Self-pay | Admitting: Family Medicine

## 2023-07-09 ENCOUNTER — Encounter: Payer: Self-pay | Admitting: Family Medicine

## 2023-07-15 DIAGNOSIS — G4733 Obstructive sleep apnea (adult) (pediatric): Secondary | ICD-10-CM | POA: Diagnosis not present

## 2023-07-16 ENCOUNTER — Other Ambulatory Visit: Payer: Self-pay | Admitting: Family Medicine

## 2023-07-16 DIAGNOSIS — R188 Other ascites: Secondary | ICD-10-CM

## 2023-07-16 DIAGNOSIS — K769 Liver disease, unspecified: Secondary | ICD-10-CM

## 2023-08-14 DIAGNOSIS — G4733 Obstructive sleep apnea (adult) (pediatric): Secondary | ICD-10-CM | POA: Diagnosis not present

## 2023-08-28 ENCOUNTER — Ambulatory Visit
Admission: RE | Admit: 2023-08-28 | Discharge: 2023-08-28 | Disposition: A | Discharge status: Home / Self Care | Payer: PPO | Source: Ambulatory Visit | Attending: Family Medicine | Admitting: Family Medicine

## 2023-08-28 DIAGNOSIS — K760 Fatty (change of) liver, not elsewhere classified: Secondary | ICD-10-CM | POA: Diagnosis not present

## 2023-08-28 DIAGNOSIS — K7460 Unspecified cirrhosis of liver: Secondary | ICD-10-CM | POA: Diagnosis not present

## 2023-08-28 DIAGNOSIS — K769 Liver disease, unspecified: Secondary | ICD-10-CM

## 2023-08-28 DIAGNOSIS — K7689 Other specified diseases of liver: Secondary | ICD-10-CM | POA: Diagnosis not present

## 2023-08-28 DIAGNOSIS — R599 Enlarged lymph nodes, unspecified: Secondary | ICD-10-CM | POA: Diagnosis not present

## 2023-08-28 MED ORDER — GADOPICLENOL 0.5 MMOL/ML IV SOLN
10.0000 mL | Freq: Once | INTRAVENOUS | Status: AC | PRN
  Administered 2023-08-28: 10 mL via INTRAVENOUS

## 2023-09-01 DIAGNOSIS — K7460 Unspecified cirrhosis of liver: Secondary | ICD-10-CM | POA: Diagnosis not present

## 2023-09-01 DIAGNOSIS — K2289 Other specified disease of esophagus: Secondary | ICD-10-CM | POA: Diagnosis not present

## 2023-09-01 DIAGNOSIS — K2961 Other gastritis with bleeding: Secondary | ICD-10-CM | POA: Diagnosis not present

## 2023-09-01 DIAGNOSIS — K2930 Chronic superficial gastritis without bleeding: Secondary | ICD-10-CM | POA: Diagnosis not present

## 2023-09-01 DIAGNOSIS — K317 Polyp of stomach and duodenum: Secondary | ICD-10-CM | POA: Diagnosis not present

## 2023-09-01 DIAGNOSIS — I8510 Secondary esophageal varices without bleeding: Secondary | ICD-10-CM | POA: Diagnosis not present

## 2023-09-08 DIAGNOSIS — K317 Polyp of stomach and duodenum: Secondary | ICD-10-CM | POA: Diagnosis not present

## 2023-09-14 DIAGNOSIS — G4733 Obstructive sleep apnea (adult) (pediatric): Secondary | ICD-10-CM | POA: Diagnosis not present

## 2023-10-14 DIAGNOSIS — G4733 Obstructive sleep apnea (adult) (pediatric): Secondary | ICD-10-CM | POA: Diagnosis not present

## 2023-12-08 DIAGNOSIS — K7460 Unspecified cirrhosis of liver: Secondary | ICD-10-CM | POA: Diagnosis not present

## 2024-01-16 DIAGNOSIS — H65191 Other acute nonsuppurative otitis media, right ear: Secondary | ICD-10-CM | POA: Diagnosis not present

## 2024-02-14 ENCOUNTER — Other Ambulatory Visit: Payer: Self-pay | Admitting: Family Medicine

## 2024-02-14 DIAGNOSIS — R188 Other ascites: Secondary | ICD-10-CM

## 2024-02-29 DIAGNOSIS — D225 Melanocytic nevi of trunk: Secondary | ICD-10-CM | POA: Diagnosis not present

## 2024-02-29 DIAGNOSIS — Z1283 Encounter for screening for malignant neoplasm of skin: Secondary | ICD-10-CM | POA: Diagnosis not present

## 2024-02-29 DIAGNOSIS — L820 Inflamed seborrheic keratosis: Secondary | ICD-10-CM | POA: Diagnosis not present

## 2024-02-29 DIAGNOSIS — L570 Actinic keratosis: Secondary | ICD-10-CM | POA: Diagnosis not present

## 2024-02-29 DIAGNOSIS — X32XXXD Exposure to sunlight, subsequent encounter: Secondary | ICD-10-CM | POA: Diagnosis not present

## 2024-03-09 DIAGNOSIS — G4733 Obstructive sleep apnea (adult) (pediatric): Secondary | ICD-10-CM | POA: Diagnosis not present

## 2024-03-13 DIAGNOSIS — R0609 Other forms of dyspnea: Secondary | ICD-10-CM | POA: Diagnosis not present

## 2024-03-13 DIAGNOSIS — R051 Acute cough: Secondary | ICD-10-CM | POA: Diagnosis not present

## 2024-03-13 DIAGNOSIS — J209 Acute bronchitis, unspecified: Secondary | ICD-10-CM | POA: Diagnosis not present

## 2024-03-13 DIAGNOSIS — Z03818 Encounter for observation for suspected exposure to other biological agents ruled out: Secondary | ICD-10-CM | POA: Diagnosis not present

## 2024-03-13 DIAGNOSIS — R0981 Nasal congestion: Secondary | ICD-10-CM | POA: Diagnosis not present

## 2024-03-13 DIAGNOSIS — R509 Fever, unspecified: Secondary | ICD-10-CM | POA: Diagnosis not present

## 2024-03-14 DIAGNOSIS — J069 Acute upper respiratory infection, unspecified: Secondary | ICD-10-CM | POA: Diagnosis not present

## 2024-03-14 DIAGNOSIS — G4733 Obstructive sleep apnea (adult) (pediatric): Secondary | ICD-10-CM | POA: Diagnosis not present

## 2024-03-19 ENCOUNTER — Ambulatory Visit
Admission: RE | Admit: 2024-03-19 | Discharge: 2024-03-19 | Disposition: A | Discharge status: Home / Self Care | Source: Ambulatory Visit | Attending: Family Medicine | Admitting: Family Medicine

## 2024-03-19 DIAGNOSIS — K7689 Other specified diseases of liver: Secondary | ICD-10-CM | POA: Diagnosis not present

## 2024-03-19 DIAGNOSIS — R188 Other ascites: Secondary | ICD-10-CM

## 2024-03-19 DIAGNOSIS — K7460 Unspecified cirrhosis of liver: Secondary | ICD-10-CM | POA: Diagnosis not present

## 2024-03-19 MED ORDER — GADOPICLENOL 0.5 MMOL/ML IV SOLN
10.0000 mL | Freq: Once | INTRAVENOUS | Status: AC | PRN
  Administered 2024-03-19: 10 mL via INTRAVENOUS

## 2024-04-24 DIAGNOSIS — I10 Essential (primary) hypertension: Secondary | ICD-10-CM | POA: Diagnosis not present

## 2024-04-24 DIAGNOSIS — F5101 Primary insomnia: Secondary | ICD-10-CM | POA: Diagnosis not present

## 2024-04-24 DIAGNOSIS — R053 Chronic cough: Secondary | ICD-10-CM | POA: Diagnosis not present

## 2024-04-24 DIAGNOSIS — K219 Gastro-esophageal reflux disease without esophagitis: Secondary | ICD-10-CM | POA: Diagnosis not present

## 2024-04-24 DIAGNOSIS — E1169 Type 2 diabetes mellitus with other specified complication: Secondary | ICD-10-CM | POA: Diagnosis not present

## 2024-04-24 DIAGNOSIS — R55 Syncope and collapse: Secondary | ICD-10-CM | POA: Diagnosis not present

## 2024-04-24 DIAGNOSIS — F439 Reaction to severe stress, unspecified: Secondary | ICD-10-CM | POA: Diagnosis not present

## 2024-05-12 ENCOUNTER — Encounter: Payer: Self-pay | Admitting: Advanced Practice Midwife

## 2024-06-06 DIAGNOSIS — F5101 Primary insomnia: Secondary | ICD-10-CM | POA: Diagnosis not present

## 2024-06-06 DIAGNOSIS — I10 Essential (primary) hypertension: Secondary | ICD-10-CM | POA: Diagnosis not present

## 2024-06-06 DIAGNOSIS — I8510 Secondary esophageal varices without bleeding: Secondary | ICD-10-CM | POA: Diagnosis not present

## 2024-06-06 DIAGNOSIS — K769 Liver disease, unspecified: Secondary | ICD-10-CM | POA: Diagnosis not present

## 2024-06-06 DIAGNOSIS — E119 Type 2 diabetes mellitus without complications: Secondary | ICD-10-CM | POA: Diagnosis not present

## 2024-06-06 DIAGNOSIS — Z8546 Personal history of malignant neoplasm of prostate: Secondary | ICD-10-CM | POA: Diagnosis not present

## 2024-06-06 DIAGNOSIS — K7460 Unspecified cirrhosis of liver: Secondary | ICD-10-CM | POA: Diagnosis not present

## 2024-06-06 DIAGNOSIS — K219 Gastro-esophageal reflux disease without esophagitis: Secondary | ICD-10-CM | POA: Diagnosis not present

## 2024-09-11 ENCOUNTER — Other Ambulatory Visit: Payer: Self-pay | Admitting: Family Medicine

## 2024-09-11 DIAGNOSIS — K769 Liver disease, unspecified: Secondary | ICD-10-CM

## 2024-09-11 DIAGNOSIS — K7460 Unspecified cirrhosis of liver: Secondary | ICD-10-CM

## 2024-11-03 ENCOUNTER — Ambulatory Visit
Admission: RE | Admit: 2024-11-03 | Discharge: 2024-11-03 | Disposition: A | Source: Ambulatory Visit | Attending: Family Medicine | Admitting: Family Medicine

## 2024-11-03 DIAGNOSIS — K769 Liver disease, unspecified: Secondary | ICD-10-CM

## 2024-11-03 DIAGNOSIS — K7460 Unspecified cirrhosis of liver: Secondary | ICD-10-CM

## 2024-11-03 MED ORDER — GADOPICLENOL 0.5 MMOL/ML IV SOLN
10.0000 mL | Freq: Once | INTRAVENOUS | Status: AC | PRN
  Administered 2024-11-03: 10 mL via INTRAVENOUS
